# Patient Record
Sex: Male | Born: 1965 | Race: White | Hispanic: No | Marital: Married | Smoking: Never smoker
Health system: Southern US, Community
[De-identification: ages and names within clinical notes are randomized; demographics above are authoritative.]

## PROBLEM LIST (undated history)

## (undated) DIAGNOSIS — R519 Headache, unspecified: Secondary | ICD-10-CM

## (undated) DIAGNOSIS — M5481 Occipital neuralgia: Secondary | ICD-10-CM

## (undated) DIAGNOSIS — G8929 Other chronic pain: Secondary | ICD-10-CM

## (undated) DIAGNOSIS — J302 Other seasonal allergic rhinitis: Secondary | ICD-10-CM

## (undated) HISTORY — DX: Occipital neuralgia: M54.81

## (undated) HISTORY — DX: Other seasonal allergic rhinitis: J30.2

## (undated) HISTORY — PX: KNEE SURGERY: SHX244

## (undated) HISTORY — DX: Other chronic pain: G89.29

## (undated) HISTORY — PX: NOSE SURGERY: SHX723

## (undated) HISTORY — DX: Headache, unspecified: R51.9

---

## 2005-01-22 ENCOUNTER — Emergency Department: Payer: Self-pay | Admitting: Unknown Physician Specialty

## 2011-09-15 ENCOUNTER — Emergency Department: Payer: Self-pay | Admitting: General Practice

## 2012-10-15 ENCOUNTER — Ambulatory Visit: Payer: Self-pay | Admitting: General Practice

## 2013-10-07 ENCOUNTER — Ambulatory Visit: Payer: Self-pay | Admitting: General Practice

## 2013-11-03 ENCOUNTER — Ambulatory Visit: Payer: Self-pay | Admitting: General Practice

## 2013-12-29 ENCOUNTER — Emergency Department: Payer: Self-pay | Admitting: General Practice

## 2018-01-21 ENCOUNTER — Other Ambulatory Visit: Payer: Self-pay | Admitting: Neurology

## 2018-01-21 DIAGNOSIS — M5481 Occipital neuralgia: Secondary | ICD-10-CM

## 2018-01-29 ENCOUNTER — Ambulatory Visit: Payer: Self-pay

## 2018-03-31 DIAGNOSIS — M5481 Occipital neuralgia: Secondary | ICD-10-CM | POA: Insufficient documentation

## 2018-03-31 DIAGNOSIS — G43019 Migraine without aura, intractable, without status migrainosus: Secondary | ICD-10-CM | POA: Insufficient documentation

## 2019-02-24 ENCOUNTER — Other Ambulatory Visit: Payer: Self-pay

## 2019-04-25 ENCOUNTER — Other Ambulatory Visit: Payer: Self-pay

## 2019-04-25 ENCOUNTER — Ambulatory Visit: Payer: Self-pay | Admitting: Internal Medicine

## 2019-04-25 ENCOUNTER — Encounter: Payer: Self-pay | Admitting: Internal Medicine

## 2019-04-25 VITALS — BP 150/104 | HR 84 | Temp 98.3°F | Ht 75.0 in | Wt 268.0 lb

## 2019-04-25 DIAGNOSIS — R030 Elevated blood-pressure reading, without diagnosis of hypertension: Secondary | ICD-10-CM

## 2019-04-25 DIAGNOSIS — L03114 Cellulitis of left upper limb: Secondary | ICD-10-CM

## 2019-04-25 DIAGNOSIS — W57XXXA Bitten or stung by nonvenomous insect and other nonvenomous arthropods, initial encounter: Secondary | ICD-10-CM

## 2019-04-25 MED ORDER — CLINDAMYCIN HCL 300 MG PO CAPS: 300.0000 mg | ORAL_CAPSULE | Freq: Three times a day (TID) | ORAL | 0 refills | Status: DC

## 2019-04-25 NOTE — Progress Notes (Signed)
S - Patient presents with skin lesion, was working in an attic last week and had a bug bite concern on his left hand & one on his forearm noted the next day, not sure of the bug, not see one and the hand lesion cleared up nicely, but the one on the forearm was more red and swollen by Friday. Then Sat was painful at night and felt up his arm some and even into his armpit some. The redness did not extend up into his elbow or armpit in a linear fashion, with some redness around the lesion extending. He iced yesterday and last night and thinks is a little better today, less swollen and painful.  Was itchy ealry, then more painful No purulent discharge from the area noted No doc'ed fevers, has not felt feverish, not feeling ill No N/V Has applied a neosporin generic type product and covered and then tightly with wrapping an overlying ACE some No h/o MRSA Not think a tick bite BP's have been high in the past at times, and then when has f/u checks, have been good and has a way to check at home but hasn't recently, never on meds for BP control Currently retired  Current Outpatient Medications on File Prior to Visit  Medication Sig Dispense Refill  . baclofen (LIORESAL) 10 MG tablet Take 1 tablet by mouth 2 (two) times a day.    . cetirizine (ZYRTEC) 10 MG tablet Take 10 mg by mouth daily.    . fluticasone (FLONASE) 50 MCG/ACT nasal spray Place 1 spray into both nostrils daily.     No current facility-administered medications on file prior to visit.      Allergies  Allergen Reactions  . Penicillins     Noted was hives and swelling when very young, not challenged since  No tob history  O - NAD, not ill appearing, overweight, masked  BP (!) 150/104 (BP Location: Right Arm, Patient Position: Sitting, Cuff Size: Large)   Pulse 84   Temp 98.3 F (36.8 C) (Oral)   Ht 6\' 3"  (1.905 m)   Wt 268 lb (121.6 kg)   SpO2 97%   BMI 33.50 kg/m     BP high today.  HEENT - sclera ancteric Skin -  erythematous raised area approx  Half-dollar-sized on left forearmin the distal 1/3,   Mild induration surrounding with central puncture type site, no drainage   Tender to palpation   Fading erythema noted surrounding and extended much greater cephalad to about mid forearm level, no linear erythematous streaks,  No obvious epitroch nodes,  no axillary adenopathy nor marked tenderness palpating the axilla Car - RRR, not tachycardic Affect not flat, approp with conversation   Ass - Abscess with cellulitis concern, probable due to bug bite by history  PCN allergy hx  Increased BP concern - possibly worsened with above infection and pain recent past (he noted hasn't slept well in last 3 nights)  Plan - Educated,  Warm compresses several times daily to help short term, can apply cold prn at times if more swelling as well Abx indicated and clindamycin tid prescribed - warned if gets diarrhea to stop and contact us as a colitis concern then arises with this abx (C Diff) and he understood Keep clean with warm, soapy water and avoid neosporin product short term, loosely cover prn with a nonstick pad and tape used to cover today and gave him some to use short term Ibuprofen with food or tylenol prn if more  painful Follow-up if not better or worsening, and if getting fevers, redness rapidly spreading, follow up more emergently in an ER setting rec'ed (as likely need IV medicines)  He will check his BP's at home as this is improved, and if the top number is >140 or bottom number > 90 with any regularity, needs to call and f/u again and he understood the importance of  this as well.

## 2019-07-25 ENCOUNTER — Ambulatory Visit: Payer: 59 | Admitting: Internal Medicine

## 2019-07-25 ENCOUNTER — Other Ambulatory Visit: Payer: Self-pay

## 2019-07-25 ENCOUNTER — Encounter: Payer: Self-pay | Admitting: Internal Medicine

## 2019-07-25 VITALS — BP 155/105 | HR 77 | Temp 97.6°F | Resp 12 | Ht 75.0 in | Wt 263.0 lb

## 2019-07-25 DIAGNOSIS — I10 Essential (primary) hypertension: Secondary | ICD-10-CM | POA: Insufficient documentation

## 2019-07-25 DIAGNOSIS — E7849 Other hyperlipidemia: Secondary | ICD-10-CM | POA: Insufficient documentation

## 2019-07-25 DIAGNOSIS — E6609 Other obesity due to excess calories: Secondary | ICD-10-CM | POA: Insufficient documentation

## 2019-07-25 DIAGNOSIS — R030 Elevated blood-pressure reading, without diagnosis of hypertension: Secondary | ICD-10-CM | POA: Insufficient documentation

## 2019-07-25 DIAGNOSIS — E782 Mixed hyperlipidemia: Secondary | ICD-10-CM | POA: Insufficient documentation

## 2019-07-25 DIAGNOSIS — R7301 Impaired fasting glucose: Secondary | ICD-10-CM | POA: Insufficient documentation

## 2019-07-25 DIAGNOSIS — E66811 Obesity, class 1: Secondary | ICD-10-CM | POA: Insufficient documentation

## 2019-07-25 MED ORDER — LISINOPRIL 10 MG PO TABS: 10.0000 mg | ORAL_TABLET | Freq: Every day | ORAL | 3 refills | Status: DC

## 2019-07-25 NOTE — Progress Notes (Signed)
S - Patient with a h/o higher BP's on prior visit in June when seen for cellulitis concern,  who follows-up today.  He was to check his BP's at home as improved, and if the top number was >140 or bottom number > 90 with any regularity, was to call and f/u again and he understood the importance of  this as well. He noted it has been higher at home with checks, often in the 140+ range over 100.  He also has been having more pressure type HA's, no vision changes. Never on BP meds in past, exercises including aerobic and feels better with exercise, no sx's with exercise Tries to eat healthy He just had ultrasounds done of his carotids, aorta and GB and told had GB stones but without any sx's, and other tests all good.  No CP, SOB, LE swelling. No h/o heart disease and had seen a cardiologist about 2-3 years ago and had a stress test and told was very good and no concerns. Does note occas when first stands up, can feel a little lightheaded.   Allergies  Allergen Reactions  . Penicillins    Current Outpatient Medications on File Prior to Visit  Medication Sig Dispense Refill  . baclofen (LIORESAL) 10 MG tablet Take 1 tablet by mouth 2 (two) times a day.    . cetirizine (ZYRTEC) 10 MG tablet Take 10 mg by mouth daily.    . fluticasone (FLONASE) 50 MCG/ACT nasal spray Place 1 spray into both nostrils daily.     No current facility-administered medications on file prior to visit.      Never smoker or used smokeless tob  FH - no HTN in M,D, GF had HD and may be due to alcohol   O - NAD, masked  BP (!) 155/105 (BP Location: Right Arm, Patient Position: Sitting, Cuff Size: Large)   Pulse 77   Temp 97.6 F (36.4 C) (Oral)   Resp 12   Ht 6\' 3"  (1.905 m)   Wt 263 lb (119.3 kg)   SpO2 98%   BMI 32.87 kg/m  Recheck manually - 138/94 on right by me Nurse checked orthostatics and reviewed   BP last visit - 150/104   HEENT - sclera anicteric Neck - carotids 2+ and = with no bruits Car -  RRR without m/g/r Pulm - CTA Abd - mildly obese,NT Ext - no LE edema Neuro - Affect not flat, approp with conversation   Last labs reviewed - 09/2018 - kidney fcn good, LDL -78, TC - 152, HDL - 39,TG's - 176, glc - 103 (Due again to check in Nov this year)  ECG from 11/19 reviewed- no concerning change from prior, SR, did have Q's inf questioning a possible inf infarct in past but not changed from recent ECGs prior    A/P -1.  HTN - concern BP's not very well controlled   Discussed BP medications to manage, discussed addition and he agreed to start lisinopril 10mg  daily, did warn of potential cough SE and usually very well tolerated noted Discussed goals for good control of BP  Importance of healthy diet and regular aerobic exercise and weight control noted Continue with home BP checks periodically after on med for a week, and keep a log discussed, and bring on F/u visit in 4 weeks, f/u sooner prn Emphasized staying well hydrated as may be an issue with his mild orthostatic sx's, as has been very hot weather in recent past   2. Increased BMI  Educated on this as he was noted to be obese by insurance evaluation  Importance of diet modifications and regular aerobic exercise emphasized Appropriate weight loss goals discussed with lifestyle changes adopted for the long term very important for success  May check labs sooner (on f/u rather than wait til Nov) to make sure tolerating the ACE inhibitor and also noting had borderline IFG and hyperlipidemia on last check (TG's up, HDL low).   F/u 4 weeks, sooner prn

## 2019-07-25 NOTE — Patient Instructions (Signed)

## 2019-07-25 NOTE — Progress Notes (Signed)
On 04/25/2019 visit, BP was 150/104.  States has checked some since last visit & it's been up.  Been eating right..  Different - when bends over & straightens up gets a little dizzy when stands up.  Head feels like it's like pressure in whole head.  Different type of H/A than occipital neuralgia.  Standing BP = 137/99 Lying BP = 134/88 Sitting BP = 139/94

## 2019-08-10 ENCOUNTER — Telehealth: Payer: Self-pay | Admitting: Registered Nurse

## 2019-08-10 ENCOUNTER — Encounter: Payer: Self-pay | Admitting: Registered Nurse

## 2019-08-10 MED ORDER — FLUTICASONE PROPIONATE 50 MCG/ACT NA SUSP: 1.0000 | Freq: Every day | NASAL | 5 refills | Status: DC

## 2019-08-10 NOTE — Telephone Encounter (Signed)
Last office visit 07/25/2019 with Dr Roxan Hockey.  Per paper chart review has been on flonase since 2010.  Last fill 02/02/2019 #1 RF5.  Electronic Rx sent to his pharmacy of choice Fluticasone 1 spray each nostril daily #1 RF5

## 2019-08-22 ENCOUNTER — Ambulatory Visit: Payer: 59 | Admitting: Physician Assistant

## 2019-08-22 ENCOUNTER — Encounter: Payer: Self-pay | Admitting: Physician Assistant

## 2019-08-22 ENCOUNTER — Other Ambulatory Visit: Payer: Self-pay

## 2019-08-22 DIAGNOSIS — Z23 Encounter for immunization: Secondary | ICD-10-CM

## 2019-08-22 NOTE — Progress Notes (Signed)
   Subjective:BP Check    Patient ID: Tony Alvarado, male    DOB: 09/27/1966, 53 y.o.   MRN: [id number]  HPI Patient presents for re-evaluation of elevated BP since starting Lisinopril last month. Denies any adverse affects from medications. Previous BP 150/105   Review of Systems Elevate BP  Overweight    Objective:   Physical Exam HEENT unremarkable Neck supple w/o adenopathy or bruits Lungs CTA Heart RRR       Assessment & Plan:  Decreased BP from 150/105 to 130/90. Continue medication and follow up in one week.

## 2019-08-22 NOTE — Progress Notes (Signed)
Started Lisinopril 10 mg 1 tab po qd after last visit with Dr. Roxan Hockey on 07/25/2019.  BP = 155/105 at 07/25/2019 visit. BP = 130/90 at today's visit.

## 2019-08-25 DIAGNOSIS — E538 Deficiency of other specified B group vitamins: Secondary | ICD-10-CM | POA: Diagnosis not present

## 2019-08-25 DIAGNOSIS — G43019 Migraine without aura, intractable, without status migrainosus: Secondary | ICD-10-CM | POA: Diagnosis not present

## 2019-08-25 DIAGNOSIS — E559 Vitamin D deficiency, unspecified: Secondary | ICD-10-CM | POA: Diagnosis not present

## 2019-08-25 DIAGNOSIS — M5481 Occipital neuralgia: Secondary | ICD-10-CM | POA: Diagnosis not present

## 2019-09-14 ENCOUNTER — Telehealth: Payer: Self-pay

## 2019-09-14 NOTE — Telephone Encounter (Signed)
Gerarda Fraction, NP-C (Interim Provider) reviewed office notes from Dr. Jennings Books (Neurologist at [facility]) from 08/25/2019 visit.  She requested F/U of whether Sam is taking Vitamin D, Magnesium & Motrin. Recommended he schedule a follow-up appt.  Called Sam.  He is taking Vitamin D, Vitamin B12 & Magnesium. He is using Motrin prn for headaches . He didn't say if he was using a "snore app". He decided to schedule a physical instead of just a follow up appt.  He is unavailable 12/4 - 12/20.  Labs scheduled for 11/16/19 at 8:30 am & Physical appt scheduled for 11/23/19 at 10:00 am.  AMD

## 2019-09-14 NOTE — Telephone Encounter (Signed)
noted 

## 2019-10-05 DIAGNOSIS — Z03818 Encounter for observation for suspected exposure to other biological agents ruled out: Secondary | ICD-10-CM | POA: Diagnosis not present

## 2019-10-20 ENCOUNTER — Encounter: Payer: Self-pay | Admitting: Neurology

## 2019-12-22 ENCOUNTER — Ambulatory Visit: Payer: Self-pay

## 2019-12-28 NOTE — Progress Notes (Signed)
Patient comes in today for pre physical labs and EKG. Patient is scheduled with Albina Billet, PA-C on 01/04/2020.

## 2019-12-29 ENCOUNTER — Other Ambulatory Visit: Payer: Self-pay

## 2019-12-29 ENCOUNTER — Ambulatory Visit: Payer: 59

## 2019-12-29 DIAGNOSIS — Z0000 Encounter for general adult medical examination without abnormal findings: Secondary | ICD-10-CM

## 2019-12-29 LAB — POCT URINALYSIS DIPSTICK
Bilirubin, UA: NEGATIVE
Blood, UA: NEGATIVE
Glucose, UA: NEGATIVE
Ketones, UA: NEGATIVE
Leukocytes, UA: NEGATIVE
Nitrite, UA: NEGATIVE
Protein, UA: NEGATIVE
Spec Grav, UA: 1.02 (ref 1.010–1.025)
Urobilinogen, UA: 0.2 E.U./dL
pH, UA: 7 (ref 5.0–8.0)

## 2019-12-30 LAB — CMP12+LP+TP+TSH+6AC+PSA+CBC…
ALT: 27 IU/L (ref 0–44)
AST: 20 IU/L (ref 0–40)
Albumin/Globulin Ratio: 2.3 — ABNORMAL HIGH (ref 1.2–2.2)
Albumin: 4.5 g/dL (ref 3.8–4.9)
Alkaline Phosphatase: 62 IU/L (ref 39–117)
BUN/Creatinine Ratio: 10 (ref 9–20)
BUN: 13 mg/dL (ref 6–24)
Basophils Absolute: 0 x10E3/uL (ref 0.0–0.2)
Basos: 1 %
Bilirubin Total: 0.7 mg/dL (ref 0.0–1.2)
Calcium: 9.5 mg/dL (ref 8.7–10.2)
Chloride: 101 mmol/L (ref 96–106)
Chol/HDL Ratio: 3.5 ratio (ref 0.0–5.0)
Cholesterol, Total: 169 mg/dL (ref 100–199)
Creatinine, Ser: 1.26 mg/dL (ref 0.76–1.27)
EOS (ABSOLUTE): 0.1 x10E3/uL (ref 0.0–0.4)
Eos: 3 %
Estimated CHD Risk: 0.6 times avg. (ref 0.0–1.0)
Free Thyroxine Index: 1.9 (ref 1.2–4.9)
GFR calc Af Amer: 75 mL/min/1.73 (ref >59)
GFR calc non Af Amer: 65 mL/min/1.73 (ref >59)
GGT: 16 IU/L (ref 0–65)
Globulin, Total: 2 g/dL (ref 1.5–4.5)
Glucose: 99 mg/dL (ref 65–99)
HDL: 48 mg/dL (ref >39)
Hematocrit: 41.5 % (ref 37.5–51.0)
Hemoglobin: 14.6 g/dL (ref 13.0–17.7)
Immature Grans (Abs): 0 x10E3/uL (ref 0.0–0.1)
Immature Granulocytes: 0 %
Iron: 128 ug/dL (ref 38–169)
LDH: 170 IU/L (ref 121–224)
LDL Chol Calc (NIH): 102 mg/dL — ABNORMAL HIGH (ref 0–99)
Lymphocytes Absolute: 1.3 x10E3/uL (ref 0.7–3.1)
Lymphs: 31 %
MCH: 31.7 pg (ref 26.6–33.0)
MCHC: 35.2 g/dL (ref 31.5–35.7)
MCV: 90 fL (ref 79–97)
Monocytes Absolute: 0.4 x10E3/uL (ref 0.1–0.9)
Monocytes: 9 %
Neutrophils Absolute: 2.4 x10E3/uL (ref 1.4–7.0)
Neutrophils: 56 %
Phosphorus: 3.1 mg/dL (ref 2.8–4.1)
Platelets: 227 x10E3/uL (ref 150–450)
Potassium: 4.5 mmol/L (ref 3.5–5.2)
Prostate Specific Ag, Serum: 1.3 ng/mL (ref 0.0–4.0)
RBC: 4.6 x10E6/uL (ref 4.14–5.80)
RDW: 12.4 % (ref 11.6–15.4)
Sodium: 138 mmol/L (ref 134–144)
T3 Uptake Ratio: 29 % (ref 24–39)
T4, Total: 6.6 ug/dL (ref 4.5–12.0)
TSH: 2.11 u[IU]/mL (ref 0.450–4.500)
Total Protein: 6.5 g/dL (ref 6.0–8.5)
Triglycerides: 106 mg/dL (ref 0–149)
Uric Acid: 5.7 mg/dL (ref 3.8–8.4)
VLDL Cholesterol Cal: 19 mg/dL (ref 5–40)
WBC: 4.2 x10E3/uL (ref 3.4–10.8)

## 2020-01-04 ENCOUNTER — Ambulatory Visit: Payer: 59 | Admitting: Registered Nurse

## 2020-01-04 ENCOUNTER — Other Ambulatory Visit: Payer: Self-pay

## 2020-01-04 VITALS — BP 140/94 | HR 72 | Temp 97.9°F | Ht 74.0 in | Wt 256.2 lb

## 2020-01-04 DIAGNOSIS — R7989 Other specified abnormal findings of blood chemistry: Secondary | ICD-10-CM

## 2020-01-04 DIAGNOSIS — E538 Deficiency of other specified B group vitamins: Secondary | ICD-10-CM

## 2020-01-04 DIAGNOSIS — Z0000 Encounter for general adult medical examination without abnormal findings: Secondary | ICD-10-CM

## 2020-01-04 DIAGNOSIS — J301 Allergic rhinitis due to pollen: Secondary | ICD-10-CM

## 2020-01-04 DIAGNOSIS — Z6832 Body mass index (BMI) 32.0-32.9, adult: Secondary | ICD-10-CM

## 2020-01-04 DIAGNOSIS — R481 Agnosia: Secondary | ICD-10-CM

## 2020-01-04 DIAGNOSIS — R430 Anosmia: Secondary | ICD-10-CM

## 2020-01-04 DIAGNOSIS — I10 Essential (primary) hypertension: Secondary | ICD-10-CM

## 2020-01-04 DIAGNOSIS — G44221 Chronic tension-type headache, intractable: Secondary | ICD-10-CM

## 2020-01-04 MED ORDER — LISINOPRIL 10 MG PO TABS: ORAL_TABLET | ORAL | 3 refills | Status: DC

## 2020-01-04 MED ORDER — FLUTICASONE PROPIONATE 50 MCG/ACT NA SUSP: 1.0000 | Freq: Every day | NASAL | 5 refills | Status: DC

## 2020-01-04 NOTE — Patient Instructions (Addendum)
Muscle Cramps and Spasms Muscle cramps and spasms occur when a muscle or muscles tighten and you have no control over this tightening (involuntary muscle contraction). They are a common problem and can develop in any muscle. The most common place is in the calf muscles of the leg. Muscle cramps and muscle spasms are both involuntary muscle contractions, but there are some differences between the two:  Muscle cramps are painful. They come and go and may last for a few seconds or up to 15 minutes. Muscle cramps are often more forceful and last longer than muscle spasms.  Muscle spasms may or may not be painful. They may also last just a few seconds or much longer. Certain medical conditions, such as diabetes or Parkinson's disease, can make it more likely to develop cramps or spasms. However, cramps or spasms are usually not caused by a serious underlying problem. Common causes include:  Doing more physical work or exercise than your body is ready for (overexertion).  Overuse from repeating certain movements too many times.  Remaining in a certain position for a long period of time.  Improper preparation, form, or technique while playing a sport or doing an activity.  Dehydration.  Injury.  Side effects of some medicines.  Abnormally low levels of the salts and minerals in your blood (electrolytes), especially potassium and calcium. This could happen if you are taking water pills (diuretics) or if you are pregnant. In many cases, the cause of muscle cramps or spasms is not known. Follow these instructions at home: Managing pain and stiffness      Try massaging, stretching, and relaxing the affected muscle. Do this for several minutes at a time.  If directed, apply heat to tight or tense muscles as often as told by your health care provider. Use the heat source that your health care provider recommends, such as a moist heat pack or a heating pad. ? Place a towel between your skin and  the heat source. ? Leave the heat on for 20-30 minutes. ? Remove the heat if your skin turns bright red. This is especially important if you are unable to feel pain, heat, or cold. You may have a greater risk of getting burned.  If directed, put ice on the affected area. This may help if you are sore or have pain after a cramp or spasm. ? Put ice in a plastic bag. ? Place a towel between your skin and the bag. ? Leavethe ice on for 20 minutes, 2-3 times a day.  Try taking hot showers or baths to help relax tight muscles. Eating and drinking  Drink enough fluid to keep your urine pale yellow. Staying well hydrated may help prevent cramps or spasms.  Eat a healthy diet that includes plenty of nutrients to help your muscles function. A healthy diet includes fruits and vegetables, lean protein, whole grains, and low-fat or nonfat dairy products. General instructions  If you are having frequent cramps, avoid intense exercise for several days.  Take over-the-counter and prescription medicines only as told by your health care provider.  Pay attention to any changes in your symptoms.  Keep all follow-up visits as told by your health care provider. This is important. Contact a health care provider if:  Your cramps or spasms get more severe or happen more often.  Your cramps or spasms do not improve over time. Summary  Muscle cramps and spasms occur when a muscle or muscles tighten and you have no control over this   tightening (involuntary muscle contraction).  The most common place for cramps or spasms to occur is in the calf muscles of the leg.  Massaging, stretching, and relaxing the affected muscle may relieve the cramp or spasm.  Drink enough fluid to keep your urine pale yellow. Staying well hydrated may help prevent cramps or spasms. This information is not intended to replace advice given to you by your health care provider. Make sure you discuss any questions you have with your  health care provider. Document Revised: 03/15/2018 Document Reviewed: 03/15/2018 Elsevier Patient Education  2020 [employer]. Tension Headache, Adult A tension headache is a feeling of pain, pressure, or aching in the head that is often felt over the front and sides of the head. The pain can be dull, or it can feel tight (constricting). There are two types of tension headache:  Episodic tension headache. This is when the headaches happen fewer than 15 days a month.  Chronic tension headache. This is when the headaches happen more than 15 days a month during a 17-month period. A tension headache can last from 30 minutes to several days. It is the most common kind of headache. Tension headaches are not normally associated with nausea or vomiting, and they do not get worse with physical activity. What are the causes? The exact cause of this condition is not known. Tension headaches are often triggered by stress, anxiety, or depression. Other triggers include:  Alcohol.  Too much caffeine or caffeine withdrawal.  Respiratory infections, such as colds, flu, or sinus infections.  Dental problems or teeth clenching.  Tiredness (fatigue).  Holding your head and neck in the same position for a long period of time, such as while using a computer.  Smoking.  Arthritis of the neck. What are the signs or symptoms? Symptoms of this condition include:  A feeling of pressure or tightness around the head.  Dull, aching head pain.  Pain over the front and sides of the head.  Tenderness in the muscles of the head, neck, and shoulders. How is this diagnosed? This condition may be diagnosed based on your symptoms, your medical history, and a physical exam. If your symptoms are severe or unusual, you may have imaging tests, such as a CT scan or an MRI of your head. Your vision may also be checked. How is this treated? This condition may be treated with lifestyle changes and with medicines that  help relieve symptoms. Follow these instructions at home: Managing pain  Take over-the-counter and prescription medicines only as told by your health care provider.  When you have a headache, lie down in a dark, quiet room.  If directed, apply ice to the head and neck: ? Put ice in a plastic bag. ? Place a towel between your skin and the bag. ? Leave the ice on for 20 minutes, 2-3 times a day.  If directed, apply heat to the back of your neck as often as told by your health care provider. Use the heat source that your health care provider recommends, such as a moist heat pack or a heating pad. ? Place a towel between your skin and the heat source. ? Leave the heat on for 20-30 minutes. ? Remove the heat if your skin turns bright red. This is especially important if you are unable to feel pain, heat, or cold. You may have a greater risk of getting burned. Eating and drinking  Eat meals on a regular schedule.  Limit alcohol intake to no  more than 1 drink a day for nonpregnant women and 2 drinks a day for men. One drink equals 12 oz of beer, 5 oz of wine, or 1 oz of hard liquor.  Drink enough fluid to keep your urine pale yellow.  Decrease your caffeine intake, or stop using caffeine. Lifestyle  Get 7-9 hours of sleep each night, or get the amount of sleep recommended by your health care provider.  At bedtime, remove all electronic devices from your room. Electronic devices include computers, phones, and tablets.  Find ways to manage your stress. Some things that can help relieve stress include: ? Exercise. ? Deep breathing exercises. ? Yoga. ? Listening to music. ? Positive mental imagery.  Try to sit up straight and avoid tensing your muscles.  Do not use any products that contain nicotine or tobacco, such as cigarettes and e-cigarettes. If you need help quitting, ask your health care provider. General instructions   Keep all follow-up visits as told by your health care  provider. This is important.  Avoid any headache triggers. Keep a headache journal to help find out what may trigger your headaches. For example, write down: ? What you eat and drink. ? How much sleep you get. ? Any change to your diet or medicines. Contact a health care provider if:  Your headache does not get better.  Your headache comes back.  You are sensitive to sounds, light, or smells because of a headache.  You have nausea or you vomit.  Your stomach hurts. Get help right away if:  You suddenly develop a very severe headache along with any of the following: ? A stiff neck. ? Nausea and vomiting. ? Confusion. ? Weakness. ? Double vision or loss of vision. ? Shortness of breath. ? Rash. ? Unusual sleepiness. ? Fever. ? Trouble speaking. ? Pain in your eyes or ears. ? Trouble walking or balancing. ? Feeling faint or passing out. Summary  A tension headache is a feeling of pain, pressure, or aching in the head that is often felt over the front and sides of the head.  A tension headache can last from 30 minutes to several days. It is the most common kind of headache.  This condition may be diagnosed based on your symptoms, your medical history, and a physical exam.  This condition may be treated with lifestyle changes and with medicines that help relieve symptoms. This information is not intended to replace advice given to you by your health care provider. Make sure you discuss any questions you have with your health care provider. Document Revised: 08/17/2019 Document Reviewed: 01/30/2017 Elsevier Patient Education  2020 [employer]. Gallbladder Eating Plan If you have a gallbladder condition, you may have trouble digesting fats. Eating a low-fat diet can help reduce your symptoms, and may be helpful before and after having surgery to remove your gallbladder (cholecystectomy). Your health care provider may recommend that you work with a diet and nutrition  specialist (dietitian) to help you reduce the amount of fat in your diet. What are tips for following this plan? General guidelines  Limit your fat intake to less than 30% of your total daily calories. If you eat around 1,800 calories each day, this is less than 60 grams (g) of fat per day.  Fat is an important part of a healthy diet. Eating a low-fat diet can make it hard to maintain a healthy body weight. Ask your dietitian how much fat, calories, and other nutrients you need each day.  Eat small, frequent meals throughout the day instead of three large meals.  Drink at least 8-10 cups of fluid a day. Drink enough fluid to keep your urine clear or pale yellow.  Limit alcohol intake to no more than 1 drink a day for nonpregnant women and 2 drinks a day for men. One drink equals 12 oz of beer, 5 oz of wine, or 1 oz of hard liquor. Reading food labels  Check Nutrition Facts on food labels for the amount of fat per serving. Choose foods with less than 3 grams of fat per serving. Shopping  Choose nonfat and low-fat healthy foods. Look for the words "nonfat," "low fat," or "fat free."  Avoid buying processed or prepackaged foods. Cooking  Cook using low-fat methods, such as baking, broiling, grilling, or boiling.  Cook with small amounts of healthy fats, such as olive oil, grapeseed oil, canola oil, or sunflower oil. What foods are recommended?   All fresh, frozen, or canned fruits and vegetables.  Whole grains.  Low-fat or non-fat (skim) milk and yogurt.  Lean meat, skinless poultry, fish, eggs, and beans.  Low-fat protein supplement powders or drinks.  Spices and herbs. What foods are not recommended?  High-fat foods. These include baked goods, fast food, fatty cuts of meat, ice cream, french toast, sweet rolls, pizza, cheese bread, foods covered with butter, creamy sauces, or cheese.  Fried foods. These include french fries, tempura, battered fish, breaded chicken, fried  breads, and sweets.  Foods with strong odors.  Foods that cause bloating and gas. Summary  A low-fat diet can be helpful if you have a gallbladder condition, or before and after gallbladder surgery.  Limit your fat intake to less than 30% of your total daily calories. This is about 60 g of fat if you eat 1,800 calories each day.  Eat small, frequent meals throughout the day instead of three large meals. This information is not intended to replace advice given to you by your health care provider. Make sure you discuss any questions you have with your health care provider. Document Revised: 02/10/2019 Document Reviewed: 11/27/2016 Elsevier Patient Education  [place]. High-Fiber Diet Fiber, also called dietary fiber, is a type of carbohydrate that is found in fruits, vegetables, whole grains, and beans. A high-fiber diet can have many health benefits. Your health care provider may recommend a high-fiber diet to help:  Prevent constipation. Fiber can make your bowel movements more regular.  Lower your cholesterol.  Relieve the following conditions: ? Swelling of veins in the anus (hemorrhoids). ? Swelling and irritation (inflammation) of specific areas of the digestive tract (uncomplicated diverticulosis). ? A problem of the large intestine (colon) that sometimes causes pain and diarrhea (irritable bowel syndrome, IBS).  Prevent overeating as part of a weight-loss plan.  Prevent heart disease, type 2 diabetes, and certain cancers. What is my plan? The recommended daily fiber intake in grams (g) includes:  38 g for men age 69 or younger.  30 g for men over age 64.  8 g for women age 40 or younger.  21 g for women over age 66. You can get the recommended daily intake of dietary fiber by:  Eating a variety of fruits, vegetables, grains, and beans.  Taking a fiber supplement, if it is not possible to get enough fiber through your diet. What do I need to know about a  high-fiber diet?  It is better to get fiber through food sources rather than from fiber supplements.  There is not a lot of research about how effective supplements are.  Always check the fiber content on the nutrition facts label of any prepackaged food. Look for foods that contain 5 g of fiber or more per serving.  Talk with a diet and nutrition specialist (dietitian) if you have questions about specific foods that are recommended or not recommended for your medical condition, especially if those foods are not listed below.  Gradually increase how much fiber you consume. If you increase your intake of dietary fiber too quickly, you may have bloating, cramping, or gas.  Drink plenty of water. Water helps you to digest fiber. What are tips for following this plan?  Eat a wide variety of high-fiber foods.  Make sure that half of the grains that you eat each day are whole grains.  Eat breads and cereals that are made with whole-grain flour instead of refined flour or white flour.  Eat brown rice, bulgur wheat, or millet instead of white rice.  Start the day with a breakfast that is high in fiber, such as a cereal that contains 5 g of fiber or more per serving.  Use beans in place of meat in soups, salads, and pasta dishes.  Eat high-fiber snacks, such as berries, raw vegetables, nuts, and popcorn.  Choose whole fruits and vegetables instead of processed forms like juice or sauce. What foods can I eat?  Fruits Berries. Pears. Apples. Oranges. Avocado. Prunes and raisins. Dried figs. Vegetables Sweet potatoes. Spinach. Kale. Artichokes. Cabbage. Broccoli. Cauliflower. Green peas. Carrots. Squash. Grains Whole-grain breads. Multigrain cereal. Oats and oatmeal. Brown rice. Barley. Bulgur wheat. Millet. Quinoa. Bran muffins. Popcorn. Rye wafer crackers. Meats and other proteins Navy, kidney, and pinto beans. Soybeans. Split peas. Lentils. Nuts and seeds. Dairy Fiber-fortified  yogurt. Beverages Fiber-fortified soy milk. Fiber-fortified orange juice. Other foods Fiber bars. The items listed above may not be a complete list of recommended foods and beverages. Contact a dietitian for more options. What foods are not recommended? Fruits Fruit juice. Cooked, strained fruit. Vegetables Fried potatoes. Canned vegetables. Well-cooked vegetables. Grains White bread. Pasta made with refined flour. White rice. Meats and other proteins Fatty cuts of meat. Fried chicken or fried fish. Dairy Milk. Yogurt. Cream cheese. Sour cream. Fats and oils Butters. Beverages Soft drinks. Other foods Cakes and pastries. The items listed above may not be a complete list of foods and beverages to avoid. Contact a dietitian for more information. Summary  Fiber is a type of carbohydrate. It is found in fruits, vegetables, whole grains, and beans.  There are many health benefits of eating a high-fiber diet, such as preventing constipation, lowering blood cholesterol, helping with weight loss, and reducing your risk of heart disease, diabetes, and certain cancers.  Gradually increase your intake of fiber. Increasing too fast can result in cramping, bloating, and gas. Drink plenty of water while you increase your fiber.  The best sources of fiber include whole fruits and vegetables, whole grains, nuts, seeds, and beans. This information is not intended to replace advice given to you by your health care provider. Make sure you discuss any questions you have with your health care provider. Document Revised: 08/24/2017 Document Reviewed: 08/24/2017 Elsevier Patient Education  2020 [employer] Inc. DASH Eating Plan DASH stands for "Dietary Approaches to Stop Hypertension." The DASH eating plan is a healthy eating plan that has been shown to reduce high blood pressure (hypertension). It may also reduce your risk for type 2 diabetes, heart disease, and  stroke. The DASH eating plan may also  help with weight loss. What are tips for following this plan?  General guidelines  Avoid eating more than 2,300 mg (milligrams) of salt (sodium) a day. If you have hypertension, you may need to reduce your sodium intake to 1,500 mg a day.  Limit alcohol intake to no more than 1 drink a day for nonpregnant women and 2 drinks a day for men. One drink equals 12 oz of beer, 5 oz of wine, or 1 oz of hard liquor.  Work with your health care provider to maintain a healthy body weight or to lose weight. Ask what an ideal weight is for you.  Get at least 30 minutes of exercise that causes your heart to beat faster (aerobic exercise) most days of the week. Activities may include walking, swimming, or biking.  Work with your health care provider or diet and nutrition specialist (dietitian) to adjust your eating plan to your individual calorie needs. Reading food labels   Check food labels for the amount of sodium per serving. Choose foods with less than 5 percent of the Daily Value of sodium. Generally, foods with less than 300 mg of sodium per serving fit into this eating plan.  To find whole grains, look for the word "whole" as the first word in the ingredient list. Shopping  Buy products labeled as "low-sodium" or "no salt added."  Buy fresh foods. Avoid canned foods and premade or frozen meals. Cooking  Avoid adding salt when cooking. Use salt-free seasonings or herbs instead of table salt or sea salt. Check with your health care provider or pharmacist before using salt substitutes.  Do not fry foods. Cook foods using healthy methods such as baking, boiling, grilling, and broiling instead.  Cook with heart-healthy oils, such as olive, canola, soybean, or sunflower oil. Meal planning  Eat a balanced diet that includes: ? 5 or more servings of fruits and vegetables each day. At each meal, try to fill half of your plate with fruits and vegetables. ? Up to 6-8 servings of whole grains each  day. ? Less than 6 oz of lean meat, poultry, or fish each day. A 3-oz serving of meat is about the same size as a deck of cards. One egg equals 1 oz. ? 2 servings of low-fat dairy each day. ? A serving of nuts, seeds, or beans 5 times each week. ? Heart-healthy fats. Healthy fats called Omega-3 fatty acids are found in foods such as flaxseeds and coldwater fish, like sardines, salmon, and mackerel.  Limit how much you eat of the following: ? Canned or prepackaged foods. ? Food that is high in trans fat, such as fried foods. ? Food that is high in saturated fat, such as fatty meat. ? Sweets, desserts, sugary drinks, and other foods with added sugar. ? Full-fat dairy products.  Do not salt foods before eating.  Try to eat at least 2 vegetarian meals each week.  Eat more home-cooked food and less restaurant, buffet, and fast food.  When eating at a restaurant, ask that your food be prepared with less salt or no salt, if possible. What foods are recommended? The items listed may not be a complete list. Talk with your dietitian about what dietary choices are best for you. Grains Whole-grain or whole-wheat bread. Whole-grain or whole-wheat pasta. Brown rice. Orpah Cobb. Bulgur. Whole-grain and low-sodium cereals. Pita bread. Low-fat, low-sodium crackers. Whole-wheat flour tortillas. Vegetables Fresh or frozen vegetables (raw, steamed, roasted, or  grilled). Low-sodium or reduced-sodium tomato and vegetable juice. Low-sodium or reduced-sodium tomato sauce and tomato paste. Low-sodium or reduced-sodium canned vegetables. Fruits All fresh, dried, or frozen fruit. Canned fruit in natural juice (without added sugar). Meat and other protein foods Skinless chicken or [place]. Ground chicken or [place]. Pork with fat trimmed off. Fish and seafood. Egg whites. Dried beans, peas, or lentils. Unsalted nuts, nut butters, and seeds. Unsalted canned beans. Lean cuts of beef with fat trimmed off.  Low-sodium, lean deli meat. Dairy Low-fat (1%) or fat-free (skim) milk. Fat-free, low-fat, or reduced-fat cheeses. Nonfat, low-sodium ricotta or cottage cheese. Low-fat or nonfat yogurt. Low-fat, low-sodium cheese. Fats and oils Soft margarine without trans fats. Vegetable oil. Low-fat, reduced-fat, or light mayonnaise and salad dressings (reduced-sodium). Canola, safflower, olive, soybean, and sunflower oils. Avocado. Seasoning and other foods Herbs. Spices. Seasoning mixes without salt. Unsalted popcorn and pretzels. Fat-free sweets. What foods are not recommended? The items listed may not be a complete list. Talk with your dietitian about what dietary choices are best for you. Grains Baked goods made with fat, such as croissants, muffins, or some breads. Dry pasta or rice meal packs. Vegetables Creamed or fried vegetables. Vegetables in a cheese sauce. Regular canned vegetables (not low-sodium or reduced-sodium). Regular canned tomato sauce and paste (not low-sodium or reduced-sodium). Regular tomato and vegetable juice (not low-sodium or reduced-sodium). Rosita Fire. Olives. Fruits Canned fruit in a light or heavy syrup. Fried fruit. Fruit in cream or butter sauce. Meat and other protein foods Fatty cuts of meat. Ribs. Fried meat. Tomasa Blase. Sausage. Bologna and other processed lunch meats. Salami. Fatback. Hotdogs. Bratwurst. Salted nuts and seeds. Canned beans with added salt. Canned or smoked fish. Whole eggs or egg yolks. Chicken or [place] with skin. Dairy Whole or 2% milk, cream, and half-and-half. Whole or full-fat cream cheese. Whole-fat or sweetened yogurt. Full-fat cheese. Nondairy creamers. Whipped toppings. Processed cheese and cheese spreads. Fats and oils Butter. Stick margarine. Lard. Shortening. Ghee. Bacon fat. Tropical oils, such as coconut, palm kernel, or palm oil. Seasoning and other foods Salted popcorn and pretzels. Onion salt, garlic salt, seasoned salt, table salt, and sea  salt. Worcestershire sauce. Tartar sauce. Barbecue sauce. Teriyaki sauce. Soy sauce, including reduced-sodium. Steak sauce. Canned and packaged gravies. Fish sauce. Oyster sauce. Cocktail sauce. Horseradish that you find on the shelf. Ketchup. Mustard. Meat flavorings and tenderizers. Bouillon cubes. Hot sauce and Tabasco sauce. Premade or packaged marinades. Premade or packaged taco seasonings. Relishes. Regular salad dressings. Where to find more information:  National Heart, Lung, and Blood Institute: PopSteam.is  American Heart Association: www.heart.org Summary  The DASH eating plan is a healthy eating plan that has been shown to reduce high blood pressure (hypertension). It may also reduce your risk for type 2 diabetes, heart disease, and stroke.  With the DASH eating plan, you should limit salt (sodium) intake to 2,300 mg a day. If you have hypertension, you may need to reduce your sodium intake to 1,500 mg a day.  When on the DASH eating plan, aim to eat more fresh fruits and vegetables, whole grains, lean proteins, low-fat dairy, and heart-healthy fats.  Work with your health care provider or diet and nutrition specialist (dietitian) to adjust your eating plan to your individual calorie needs. This information is not intended to replace advice given to you by your health care provider. Make sure you discuss any questions you have with your health care provider. Document Revised: 10/02/2017 Document Reviewed: 10/13/2016 Elsevier Patient Education  2020 [employer] Managing Your Hypertension Hypertension is commonly called high blood pressure. This is when the force of your blood pressing against the walls of your arteries is too strong. Arteries are blood vessels that carry blood from your heart throughout your body. Hypertension forces the heart to work harder to pump blood, and may cause the arteries to become narrow or stiff. Having untreated or uncontrolled hypertension can  cause heart attack, stroke, kidney disease, and other problems. What are blood pressure readings? A blood pressure reading consists of a higher number over a lower number. Ideally, your blood pressure should be below 120/80. The first ("top") number is called the systolic pressure. It is a measure of the pressure in your arteries as your heart beats. The second ("bottom") number is called the diastolic pressure. It is a measure of the pressure in your arteries as the heart relaxes. What does my blood pressure reading mean? Blood pressure is classified into four stages. Based on your blood pressure reading, your health care provider may use the following stages to determine what type of treatment you need, if any. Systolic pressure and diastolic pressure are measured in a unit called mm Hg. Normal  Systolic pressure: below 120.  Diastolic pressure: below 80. Elevated  Systolic pressure: 120-129.  Diastolic pressure: below 80. Hypertension stage 1  Systolic pressure: 130-139.  Diastolic pressure: 80-89. Hypertension stage 2  Systolic pressure: 140 or above.  Diastolic pressure: 90 or above. What health risks are associated with hypertension? Managing your hypertension is an important responsibility. Uncontrolled hypertension can lead to:  A heart attack.  A stroke.  A weakened blood vessel (aneurysm).  Heart failure.  Kidney damage.  Eye damage.  Metabolic syndrome.  Memory and concentration problems. What changes can I make to manage my hypertension? Hypertension can be managed by making lifestyle changes and possibly by taking medicines. Your health care provider will help you make a plan to bring your blood pressure within a normal range. Eating and drinking   Eat a diet that is high in fiber and potassium, and low in salt (sodium), added sugar, and fat. An example eating plan is called the DASH (Dietary Approaches to Stop Hypertension) diet. To eat this way: ? Eat  plenty of fresh fruits and vegetables. Try to fill half of your plate at each meal with fruits and vegetables. ? Eat whole grains, such as whole wheat pasta, brown rice, or whole grain bread. Fill about one quarter of your plate with whole grains. ? Eat low-fat diary products. ? Avoid fatty cuts of meat, processed or cured meats, and poultry with skin. Fill about one quarter of your plate with lean proteins such as fish, chicken without skin, beans, eggs, and tofu. ? Avoid premade and processed foods. These tend to be higher in sodium, added sugar, and fat.  Reduce your daily sodium intake. Most people with hypertension should eat less than 1,500 mg of sodium a day.  Limit alcohol intake to no more than 1 drink a day for nonpregnant women and 2 drinks a day for men. One drink equals 12 oz of beer, 5 oz of wine, or 1 oz of hard liquor. Lifestyle  Work with your health care provider to maintain a healthy body weight, or to lose weight. Ask what an ideal weight is for you.  Get at least 30 minutes of exercise that causes your heart to beat faster (aerobic exercise) most days of the week. Activities may include walking, swimming, or  biking.  Include exercise to strengthen your muscles (resistance exercise), such as weight lifting, as part of your weekly exercise routine. Try to do these types of exercises for 30 minutes at least 3 days a week.  Do not use any products that contain nicotine or tobacco, such as cigarettes and e-cigarettes. If you need help quitting, ask your health care provider.  Control any long-term (chronic) conditions you have, such as high cholesterol or diabetes. Monitoring  Monitor your blood pressure at home as told by your health care provider. Your personal target blood pressure may vary depending on your medical conditions, your age, and other factors.  Have your blood pressure checked regularly, as often as told by your health care provider. Working with your health  care provider  Review all the medicines you take with your health care provider because there may be side effects or interactions.  Talk with your health care provider about your diet, exercise habits, and other lifestyle factors that may be contributing to hypertension.  Visit your health care provider regularly. Your health care provider can help you create and adjust your plan for managing hypertension. Will I need medicine to control my blood pressure? Your health care provider may prescribe medicine if lifestyle changes are not enough to get your blood pressure under control, and if:  Your systolic blood pressure is 130 or higher.  Your diastolic blood pressure is 80 or higher. Take medicines only as told by your health care provider. Follow the directions carefully. Blood pressure medicines must be taken as prescribed. The medicine does not work as well when you skip doses. Skipping doses also puts you at risk for problems. Contact a health care provider if:  You think you are having a reaction to medicines you have taken.  You have repeated (recurrent) headaches.  You feel dizzy.  You have swelling in your ankles.  You have trouble with your vision. Get help right away if:  You develop a severe headache or confusion.  You have unusual weakness or numbness, or you feel faint.  You have severe pain in your chest or abdomen.  You vomit repeatedly.  You have trouble breathing. Summary  Hypertension is when the force of blood pumping through your arteries is too strong. If this condition is not controlled, it may put you at risk for serious complications.  Your personal target blood pressure may vary depending on your medical conditions, your age, and other factors. For most people, a normal blood pressure is less than 120/80.  Hypertension is managed by lifestyle changes, medicines, or both. Lifestyle changes include weight loss, eating a healthy, low-sodium diet,  exercising more, and limiting alcohol. This information is not intended to replace advice given to you by your health care provider. Make sure you discuss any questions you have with your health care provider. Document Revised: 02/11/2019 Document Reviewed: 09/17/2016 Elsevier Patient Education  2020 [employer] High Cholesterol  High cholesterol is a condition in which the blood has high levels of a white, waxy, fat-like substance (cholesterol). The human body needs small amounts of cholesterol. The liver makes all the cholesterol that the body needs. Extra (excess) cholesterol comes from the food that we eat. Cholesterol is carried from the liver by the blood through the blood vessels. If you have high cholesterol, deposits (plaques) may build up on the walls of your blood vessels (arteries). Plaques make the arteries narrower and stiffer. Cholesterol plaques increase your risk for heart attack and stroke. Work  with your health care provider to keep your cholesterol levels in a healthy range. What increases the risk? This condition is more likely to develop in people who:  Eat foods that are high in animal fat (saturated fat) or cholesterol.  Are overweight.  Are not getting enough exercise.  Have a family history of high cholesterol. What are the signs or symptoms? There are no symptoms of this condition. How is this diagnosed? This condition may be diagnosed from the results of a blood test.  If you are older than age 54, your health care provider may check your cholesterol every 4-6 years.  You may be checked more often if you already have high cholesterol or other risk factors for heart disease. The blood test for cholesterol measures:  "Bad" cholesterol (LDL cholesterol). This is the main type of cholesterol that causes heart disease. The desired level for LDL is less than 100.  "Good" cholesterol (HDL cholesterol). This type helps to protect against heart disease by cleaning  the arteries and carrying the LDL away. The desired level for HDL is 60 or higher.  Triglycerides. These are fats that the body can store or burn for energy. The desired number for triglycerides is lower than 150.  Total cholesterol. This is a measure of the total amount of cholesterol in your blood, including LDL cholesterol, HDL cholesterol, and triglycerides. A healthy number is less than 200. How is this treated? This condition is treated with diet changes, lifestyle changes, and medicines. Diet changes  This may include eating more whole grains, fruits, vegetables, nuts, and fish.  This may also include cutting back on red meat and foods that have a lot of added sugar. Lifestyle changes  Changes may include getting at least 40 minutes of aerobic exercise 3 times a week. Aerobic exercises include walking, biking, and swimming. Aerobic exercise along with a healthy diet can help you maintain a healthy weight.  Changes may also include quitting smoking. Medicines  Medicines are usually given if diet and lifestyle changes have failed to reduce your cholesterol to healthy levels.  Your health care provider may prescribe a statin medicine. Statin medicines have been shown to reduce cholesterol, which can reduce the risk of heart disease. Follow these instructions at home: Eating and drinking If told by your health care provider:  Eat chicken (without skin), fish, veal, shellfish, ground [place] breast, and round or loin cuts of red meat.  Do not eat fried foods or fatty meats, such as hot dogs and salami.  Eat plenty of fruits, such as apples.  Eat plenty of vegetables, such as broccoli, potatoes, and carrots.  Eat beans, peas, and lentils.  Eat grains such as barley, rice, couscous, and bulgur wheat.  Eat pasta without cream sauces.  Use skim or nonfat milk, and eat low-fat or nonfat yogurt and cheeses.  Do not eat or drink whole milk, cream, ice cream, egg yolks, or hard  cheeses.  Do not eat stick margarine or tub margarines that contain trans fats (also called partially hydrogenated oils).  Do not eat saturated tropical oils, such as coconut oil and palm oil.  Do not eat cakes, cookies, crackers, or other baked goods that contain trans fats.  General instructions  Exercise as directed by your health care provider. Increase your activity level with activities such as gardening, walking, and taking the stairs.  Take over-the-counter and prescription medicines only as told by your health care provider.  Do not use any products that contain  nicotine or tobacco, such as cigarettes and e-cigarettes. If you need help quitting, ask your health care provider.  Keep all follow-up visits as told by your health care provider. This is important. Contact a health care provider if:  You are struggling to maintain a healthy diet or weight.  You need help to start on an exercise program.  You need help to stop smoking. Get help right away if:  You have chest pain.  You have trouble breathing. This information is not intended to replace advice given to you by your health care provider. Make sure you discuss any questions you have with your health care provider. Document Revised: 10/23/2017 Document Reviewed: 04/19/2016 Elsevier Patient Education  2020 [employer]. Allergic Rhinitis, Adult Allergic rhinitis is an allergic reaction that affects the mucous membrane inside the nose. It causes sneezing, a runny or stuffy nose, and the feeling of mucus going down the back of the throat (postnasal drip). Allergic rhinitis can be mild to severe. There are two types of allergic rhinitis:  Seasonal. This type is also called hay fever. It happens only during certain seasons.  Perennial. This type can happen at any time of the year. What are the causes? This condition happens when the body's defense system (immune system) responds to certain harmless substances called  allergens as though they were germs.  Seasonal allergic rhinitis is triggered by pollen, which can come from grasses, trees, and weeds. Perennial allergic rhinitis may be caused by:  House dust mites.  Pet dander.  Mold spores. What are the signs or symptoms? Symptoms of this condition include:  Sneezing.  Runny or stuffy nose (nasal congestion).  Postnasal drip.  Itchy nose.  Tearing of the eyes.  Trouble sleeping.  Daytime sleepiness. How is this diagnosed? This condition may be diagnosed based on:  Your medical history.  A physical exam.  Tests to check for related conditions, such as: ? Asthma. ? Pink eye. ? Ear infection. ? Upper respiratory infection.  Tests to find out which allergens trigger your symptoms. These may include skin or blood tests. How is this treated? There is no cure for this condition, but treatment can help control symptoms. Treatment may include:  Taking medicines that block allergy symptoms, such as antihistamines. Medicine may be given as a shot, nasal spray, or pill.  Avoiding the allergen.  Desensitization. This treatment involves getting ongoing shots until your body becomes less sensitive to the allergen. This treatment may be done if other treatments do not help.  If taking medicine and avoiding the allergen does not work, new, stronger medicines may be prescribed. Follow these instructions at home:  Find out what you are allergic to. Common allergens include smoke, dust, and pollen.  Avoid the things you are allergic to. These are some things you can do to help avoid allergens: ? Replace carpet with wood, tile, or vinyl flooring. Carpet can trap dander and dust. ? Do not smoke. Do not allow smoking in your home. ? Change your heating and air conditioning filter at least once a month. ? During allergy season:  Keep windows closed as much as possible.  Plan outdoor activities when pollen counts are lowest. This is usually  during the evening hours.  When coming indoors, change clothing and shower before sitting on furniture or bedding.  Take over-the-counter and prescription medicines only as told by your health care provider.  Keep all follow-up visits as told by your health care provider. This is important. Contact a  health care provider if:  You have a fever.  You develop a persistent cough.  You make whistling sounds when you breathe (you wheeze).  Your symptoms interfere with your normal daily activities. Get help right away if:  You have shortness of breath. Summary  This condition can be managed by taking medicines as directed and avoiding allergens.  Contact your health care provider if you develop a persistent cough or fever.  During allergy season, keep windows closed as much as possible. This information is not intended to replace advice given to you by your health care provider. Make sure you discuss any questions you have with your health care provider. Document Revised: 10/02/2017 Document Reviewed: 11/27/2016 Elsevier Patient Education  2020 [employer].

## 2020-01-04 NOTE — Progress Notes (Signed)
Subjective:    Patient ID: Tony Alvarado, male    DOB: 1966-07-24, 54 y.o.   MRN: [id number]  53y/o caucasian male established patient notes headaches triggered with shoulder shrugs/lifting heavier weights and using neck/shoulders to assist lift  Denied loss of bowel/bladder control, saddle paresthesias, arm/leg weakness.  Neurology [facility] requested repeat B12 and vitamin D in 6 months after visit Oct 2020 B12 228 at that visit and prior 318 and Vitamin D 31 previous 23 per chart review Epic.  Covid positive test late December with [facility] (provider doesn't use epic) symptoms achy body aches, chills, tired x 3-4 days treated with theraflu; cough and tight chest day 4 did teledoc woke up next morning and symptoms resolved improved activity   Denied fever  Then loss of taste and smell persisting.  Dot due Nov 2021  Still on baclofen daily for headaches 10mg  up to BID.  Lipids low HDL and elevated trigs/LDL last physical 09/21/2018 BP 140/94 pulse 67 weight 264 lbs on chart review TDAP 2012 ua normal headaches continue baclofen prn and neurology f/u, wt loss/increase fiber BMI 32.99, low HDL repeat lipids 1 year exercise 150 minutes per week decrease added sugars in diet.  Patient reported since that office visit he has been riding bike, lifting weights, circuit training.  Patient reports goal to lose 25 lbs and decrease to 225lbs   Seasonal allergies history of singulair, flonase use and wants flonase refill allegra OTC for antihistamine now as he switches it up/finds that using one stops working after a while; he reports possible ?gallstone attack epigastric and wrapped around RUQ stopped fried foods and fat for 1 week and no further symptoms taking milk thistle supplement and fiber and pain resolved no further concerns; hx  GERD denied cough/voice changes/dyphagia/dysphasia;  Colonoscopy 03/2017 next due 2028  Review [facility] [place] paper chart: PMHx seasonal rhinitis (had injections for a while), left  lateral epicondylitis, chest pain 03/2013, GERD, chronic headaches Rx elavil 10mg  po qhs 2017, flonase 2013, 2018-2020; azelastine 2015; loratadine, zyrtec, mucinex alternates; valtrex 2018-2019, singulair 2018-2020; allegra D 2010;  labs [phone] highest glucose 103 lowest 66; cholesterol 183 high 155 low; trigs 66-171 HDL 42-52 LDL 83-109 hgb/hct 13.2-15.1/37.9-43 09/2017 glucose 90 trigs 108 HDL 48 LDL 91 09/2018 labs glucose 103 total chol 152 trigs 176 HDL 39 LDL 78TSH 3.55 PSA 1.0 Hgb Hct 13.8/39.4 decreased gym time 8 lb weight gain; elevated glucose decreased HDL and increased triglycerides had banana & pecans prior to these labs nonfasting encouraged weight loss/exercise/fiber     Review of Systems  Constitutional: Negative for activity change, appetite change, chills, diaphoresis, fatigue, fever and unexpected weight change.  HENT: Negative for congestion, ear discharge, ear pain, facial swelling, mouth sores, nosebleeds, postnasal drip, rhinorrhea, sinus pressure, sinus pain, trouble swallowing and voice change.   Eyes: Negative for photophobia and visual disturbance.  Respiratory: Negative for cough, shortness of breath, wheezing and stridor.   Cardiovascular: Negative for chest pain, palpitations and leg swelling.  Gastrointestinal: Positive for abdominal pain. Negative for abdominal distention, constipation, diarrhea, nausea and vomiting.  Endocrine: Negative for cold intolerance and heat intolerance.  Genitourinary: Negative for difficulty urinating.  Musculoskeletal: Positive for myalgias, neck pain and neck stiffness. Negative for arthralgias, back pain, gait problem and joint swelling.  Skin: Negative for rash.  Allergic/Immunologic: Positive for environmental allergies. Negative for food allergies.  Neurological: Positive for headaches. Negative for dizziness, tremors, seizures, syncope, facial asymmetry, speech difficulty, weakness, light-headedness and numbness.  Hematological: Negative for adenopathy. Does not bruise/bleed easily.  Psychiatric/Behavioral: Negative for agitation, confusion and sleep disturbance.       Objective:   Physical Exam Vitals and nursing note reviewed.  Constitutional:      General: He is awake. He is not in acute distress.    Appearance: Normal appearance. He is well-developed and well-groomed. He is obese. He is not ill-appearing, toxic-appearing or diaphoretic.  HENT:     Head: Normocephalic and atraumatic.     Jaw: There is normal jaw occlusion.     Salivary Glands: Right salivary gland is not diffusely enlarged or tender. Left salivary gland is not diffusely enlarged or tender.     Right Ear: Hearing, tympanic membrane, ear canal and external ear normal. There is no impacted cerumen.     Left Ear: Hearing, tympanic membrane, ear canal and external ear normal. There is no impacted cerumen.     Nose: Nose normal.     Right Sinus: No maxillary sinus tenderness or frontal sinus tenderness.     Left Sinus: No maxillary sinus tenderness or frontal sinus tenderness.     Mouth/Throat:     Lips: Pink. No lesions.     Mouth: Mucous membranes are moist. No lacerations, oral lesions or angioedema.     Dentition: No gum lesions.     Tongue: No lesions. Tongue does not deviate from midline.     Palate: No mass and lesions.     Pharynx: Oropharynx is clear. Uvula midline. No pharyngeal swelling, oropharyngeal exudate, posterior oropharyngeal erythema or uvula swelling.     Tonsils: No tonsillar exudate. 0 on the right. 0 on the left.  Eyes:     General: Lids are normal. Vision grossly intact. Gaze aligned appropriately. No allergic shiner, visual field deficit or scleral icterus.       Right eye: No discharge.        Left eye: No discharge.     Extraocular Movements: Extraocular movements intact.     Conjunctiva/sclera: Conjunctivae normal.     Pupils: Pupils are equal, round, and reactive to light.  Neck:     Thyroid: No  thyroid mass, thyromegaly or thyroid tenderness.     Trachea: Trachea normal.  Cardiovascular:     Rate and Rhythm: Normal rate and regular rhythm.     Pulses: Normal pulses.          Radial pulses are 2+ on the right side and 2+ on the left side.     Heart sounds: Normal heart sounds, S1 normal and S2 normal. Heart sounds not distant. No murmur.  Pulmonary:     Effort: Pulmonary effort is normal. No respiratory distress.     Breath sounds: Normal breath sounds and air entry. No stridor, decreased air movement or transmitted upper airway sounds. No decreased breath sounds, wheezing, rhonchi or rales.     Comments: Wearing cloth mask due to covid 19 pandemic; patient frequently adjusting mask sliding below nose/mouth; spoke full sentences without difficulty; no cough observed in exam room;  Abdominal:     General: Abdomen is flat. Bowel sounds are normal. There is no distension.     Palpations: Abdomen is soft. There is no shifting dullness, fluid wave, hepatomegaly, splenomegaly, mass or pulsatile mass.     Tenderness: There is no abdominal tenderness. There is no right CVA tenderness, left CVA tenderness, guarding or rebound. Negative signs include Murphy's sign.     Hernia: There is no hernia in the umbilical area.  Comments: Dull to percussion x 4 quads; normoactive bowel sounds x 4 quads  Musculoskeletal:        General: No swelling, tenderness, deformity or signs of injury. Normal range of motion.     Right shoulder: Normal.     Left shoulder: Normal.     Right elbow: Normal.     Left elbow: Normal.     Right hand: Normal.     Left hand: Normal.     Cervical back: Normal, normal range of motion and neck supple. No swelling, edema, deformity, erythema, signs of trauma, lacerations, rigidity, spasms, torticollis, tenderness, bony tenderness or crepitus. No pain with movement, spinous process tenderness or muscular tenderness. Normal range of motion.     Thoracic back: Normal.      Lumbar back: Normal.     Right hip: Normal.     Left hip: Normal.     Right knee: Normal.     Left knee: Normal.     Right lower leg: No edema.     Left lower leg: No edema.  Lymphadenopathy:     Head:     Right side of head: No submental, submandibular, tonsillar or preauricular adenopathy.     Left side of head: No submental, submandibular, tonsillar or preauricular adenopathy.     Cervical: No cervical adenopathy.     Right cervical: No superficial cervical adenopathy.    Left cervical: No superficial cervical adenopathy.  Skin:    General: Skin is warm and dry.     Capillary Refill: Capillary refill takes less than 2 seconds.     Coloration: Skin is not ashen, cyanotic, jaundiced, mottled, pale or sallow.     Findings: No abrasion, abscess, acne, bruising, burn, ecchymosis, erythema, signs of injury, laceration, lesion, petechiae, rash or wound.     Nails: There is no clubbing.  Neurological:     General: No focal deficit present.     Mental Status: He is alert and oriented to person, place, and time. Mental status is at baseline.     Cranial Nerves: Cranial nerves are intact. No cranial nerve deficit, dysarthria or facial asymmetry.     Sensory: Sensation is intact. No sensory deficit.     Motor: Motor function is intact. No weakness, tremor, atrophy, abnormal muscle tone or seizure activity.     Coordination: Coordination is intact. Coordination normal.     Gait: Gait is intact. Gait normal.     Comments: Gait sure and steady in clinic; on/off exam table and in/out of chair without difficulty; bilateral hand grasp equal 5/5  Psychiatric:        Attention and Perception: Attention and perception normal.        Mood and Affect: Mood and affect normal.        Speech: Speech normal.        Behavior: Behavior normal. Behavior is cooperative.        Thought Content: Thought content normal.        Cognition and Memory: Cognition and memory normal.        Judgment: Judgment normal.      Results discussed in detail with patient at office visit given printed copy of results and results note and compared to previous results on file in paper chart with patient.  Follow up elevated LDL 102. Plan to give high fiber diet, mediteranean or dash diet and cholesterol handouts at appt; BP and BMI elevated above recommended levels blood pressure greater than 110s/60s and height  weight ratio of 25 I recommend weight loss, exercise 150 minutes per week; dietary fiber 30 grams per day men; eat whole grains/fruits/vegetables; keep added sugars to less than 150 calories/9 teaspoons for men per American Heart Association; prostate, electrolytes, iron, kidney/liver function, glucose, thyroid and complete blood count normal   Patient verbalized understanding information/instructions, agreed with plan of care and had no further questions at this time.  Results for Tony Alvarado, Tony Alvarado (MRN [id number]) as of 01/06/2020 00:57  Ref. Range 12/29/2019 08:26  Sodium Latest Ref Range: 134 - 144 mmol/L 138  Potassium Latest Ref Range: 3.5 - 5.2 mmol/L 4.5  Chloride Latest Ref Range: 96 - 106 mmol/L 101  Glucose Latest Ref Range: 65 - 99 mg/dL 99  BUN Latest Ref Range: 6 - 24 mg/dL 13  Creatinine Latest Ref Range: 0.76 - 1.27 mg/dL 0.10  Calcium Latest Ref Range: 8.7 - 10.2 mg/dL 9.5  BUN/Creatinine Ratio Latest Ref Range: 9 - 20  10  Phosphorus Latest Ref Range: 2.8 - 4.1 mg/dL 3.1  Alkaline Phosphatase Latest Ref Range: 39 - 117 IU/L 62  Albumin Latest Ref Range: 3.8 - 4.9 g/dL 4.5  Albumin/Globulin Ratio Latest Ref Range: 1.2 - 2.2  2.3 (H)  Uric Acid Latest Ref Range: 3.8 - 8.4 mg/dL 5.7  AST Latest Ref Range: 0 - 40 IU/L 20  ALT Latest Ref Range: 0 - 44 IU/L 27  Total Protein Latest Ref Range: 6.0 - 8.5 g/dL 6.5  Total Bilirubin Latest Ref Range: 0.0 - 1.2 mg/dL 0.7  GGT Latest Ref Range: 0 - 65 IU/L 16  GFR, Est Non African American Latest Ref Range: >59 mL/min/1.73 65  GFR, Est African American  Latest Ref Range: >59 mL/min/1.73 75  Estimated CHD Risk Latest Ref Range: 0.0 - 1.0 times avg. 0.6  LDH Latest Ref Range: 121 - 224 IU/L 170  Total CHOL/HDL Ratio Latest Ref Range: 0.0 - 5.0 ratio 3.5  Cholesterol, Total Latest Ref Range: 100 - 199 mg/dL 932  HDL Cholesterol Latest Ref Range: >39 mg/dL 48  Triglycerides Latest Ref Range: 0 - 149 mg/dL 355  VLDL Cholesterol Cal Latest Ref Range: 5 - 40 mg/dL 19  LDL Chol Calc (NIH) Latest Ref Range: 0 - 99 mg/dL 732 (H)  Iron Latest Ref Range: 38 - 169 ug/dL 202  Globulin, Total Latest Ref Range: 1.5 - 4.5 g/dL 2.0  WBC Latest Ref Range: 3.4 - 10.8 x10E3/uL 4.2  RBC Latest Ref Range: 4.14 - 5.80 x10E6/uL 4.60  Hemoglobin Latest Ref Range: 13.0 - 17.7 g/dL 54.2  HCT Latest Ref Range: 37.5 - 51.0 % 41.5  MCV Latest Ref Range: 79 - 97 fL 90  MCH Latest Ref Range: 26.6 - 33.0 pg 31.7  MCHC Latest Ref Range: 31.5 - 35.7 g/dL 70.6  RDW Latest Ref Range: 11.6 - 15.4 % 12.4  Platelets Latest Ref Range: 150 - 450 x10E3/uL 227  Neutrophils Latest Ref Range: Not Estab. % 56  Immature Granulocytes Latest Ref Range: Not Estab. % 0  NEUT# Latest Ref Range: 1.4 - 7.0 x10E3/uL 2.4  Lymphocyte # Latest Ref Range: 0.7 - 3.1 x10E3/uL 1.3  Monocytes Absolute Latest Ref Range: 0.1 - 0.9 x10E3/uL 0.4  Basophils Absolute Latest Ref Range: 0.0 - 0.2 x10E3/uL 0.0  Immature Grans (Abs) Latest Ref Range: 0.0 - 0.1 x10E3/uL 0.0  Lymphs Latest Ref Range: Not Estab. % 31  Monocytes Latest Ref Range: Not Estab. % 9  Basos Latest Ref Range: Not Estab. %  1  Eos Latest Ref Range: Not Estab. % 3  EOS (ABSOLUTE) Latest Ref Range: 0.0 - 0.4 x10E3/uL 0.1  TSH Latest Ref Range: 0.450 - 4.500 uIU/mL 2.110  Thyroxine (T4) Latest Ref Range: 4.5 - 12.0 ug/dL 6.6  Free Thyroxine Index Latest Ref Range: 1.2 - 4.9  1.9  T3 Uptake Ratio Latest Ref Range: 24 - 39 % 29  Prostate Specific Ag, Serum Latest Ref Range: 0.0 - 4.0 ng/mL 1.3  Results for Tony Alvarado, Tony Alvarado (MRN  [id number]) as of 01/06/2020 00:58  Ref. Range 12/29/2019 08:40  Bilirubin, UA Unknown negative  Clarity, UA Unknown clear  Color, UA Unknown yellow  Glucose Latest Ref Range: Negative  Negative  Ketones, UA Unknown negative  Leukocytes,UA Latest Ref Range: Negative  Negative  Nitrite, UA Unknown negative  pH, UA Latest Ref Range: 5.0 - 8.0  7.0  Protein,UA Latest Ref Range: Negative  Negative  Specific Gravity, UA Latest Ref Range: 1.010 - 1.025  1.020  Urobilinogen, UA Latest Ref Range: 0.2 or 1.0 E.U./dL 0.2  RBC, UA Unknown negative       Assessment & Plan:  A-annual physical, essential hypertension, BMI 32.89, chronic headache, seasonal allergic rhinitis,  history of low vitamin D, history low B12  P-DOT due Nov 2021 schedule follow; possible gallbladder issues pain resolved with diet modification.  Exitcare handout printed and given on gallbladder diet. Discussed gallbladder stones/cholecystitis symptoms, possible risks and treatment (surgery/diet) with patient.  He is aware if pain reoccurs/worsening pain/vomiting to seek same day re-evaluation with a provider as risk of rupture if duct blocked by stone. Neurology requested vitamin D and B12 level ordered for patient nonfasting due this month (6 month follow up from Oct 2020 visit)  Denied health concerns at this time and going to work on further weight loss.  Next routine physical in 1 year.  Repeat male executive panel fasting. Discussed covid vaccination recommended in 3 months; no preference as to manufacturer.  Current studies show at 3 months after symptomatic covid 90% patients typically still have antibodies and not seeing reinfection typically prior to 90 days but has been reported after 90 days post infection.  [place] studied showed serum antibodies 80% of patients tested at 5 month mark.  Discussed health dept [place], [place] and federal govt starting vaccination site in [place] next week 3K shots per day.  Continue social  distancing, wearing mask and washing hands. Patient verbalized understanding information/instructions, agreed with plan of care and had no further questions at this time.  Discussed weight loss/exercise  BP above goal 110-120s/60-70s  Increase lisinopril to 10mg  po qam and 5mg  po qpm electronic Rx to his pharmacy of choice #[id number] RF3   Continue to monitor blood pressure at home and maintain log of blood pressure and pulse to bring to follow up appointments. Continue low sodium/DASH diet and exercise program 150 minutes exercise/activity per week.  Recommended weight loss/weight maintenance to BMI 20-25. Return to the clinic if any new symptoms/notify clinic staff if visual changes, frequent headache, chest pain or dyspnea on mild or  minimal exertion. Exitcare handout on managing hypertension, dash diet, high fiber diet, gallbladder diet printed and given to patient.  Patient verbalized agreement and understanding of treatment plan and had no further questions at this time. P2: Diet and Exercise specific for HTN  Continue baclofen 10mg  po BID prn for headaches.  Daily stretches  exitcare handout on tension headaches and muscle spasms printed and given to patient.   If  changes in frequency/severity or new visual changes, arm/leg weakness, dysphagia, dysphasia follow up for same day re-evaluation.  Patient verbalized understanding information/instructions, agreed with plan of care and had no further questions at this time.  Patient may use normal saline nasal spray 2 sprays each nostril q2h wa as needed. Refilled flonase 50mcg 1 spray each nostril daily #16gm RF5.  Patient denied personal or family history of ENT cancer.  OTC antihistamine of choice allegra 180mg  po daily.  Avoid triggers if possible.  Shower prior to bedtime if exposed to triggers.  If allergic dust/dust mites recommend mattress/pillow covers/encasements; washing linens, vacuuming, sweeping, dusting weekly.  Call or return to clinic as needed if  these symptoms worsen or fail to improve as anticipated.   Exitcare handout on allergic rhinitis printed and given to patient.  Patient verbalized understanding of instructions, agreed with plan of care and had no further questions at this time.  P2:  Avoidance and hand washing.  Hyperlipidemia exitcare handout on high cholesterol printed and given to patient.  Discussed lifestyle modification re diet to decrease white starches/sugars e.g. potatoes/bread increase omega 3 sources nuts, fish options add more fruits and vegetables and lean protein choices such as hummus or yogurt. Discussed weight loss. Patient agreed with plan of care and verbalized understanding of information/instructions and had no further questions at this time.   Discussed with patient long covid symptoms affecting some patients.  Most patients with return of loss taste/smell by 3 month post infection.  Patient reporting sometimes can taste or smell but not as was prior to covid infection.  Will continue to monitor if no improvement or worsening consider neurology referral.  Patient verbalized understanding information/instructions, agreed with plan of care and had no further questions at this time.

## 2020-01-06 DIAGNOSIS — E538 Deficiency of other specified B group vitamins: Secondary | ICD-10-CM | POA: Insufficient documentation

## 2020-01-06 DIAGNOSIS — G44221 Chronic tension-type headache, intractable: Secondary | ICD-10-CM | POA: Insufficient documentation

## 2020-01-06 DIAGNOSIS — Z6832 Body mass index (BMI) 32.0-32.9, adult: Secondary | ICD-10-CM | POA: Insufficient documentation

## 2020-01-06 DIAGNOSIS — J301 Allergic rhinitis due to pollen: Secondary | ICD-10-CM | POA: Insufficient documentation

## 2020-01-06 DIAGNOSIS — R7989 Other specified abnormal findings of blood chemistry: Secondary | ICD-10-CM | POA: Insufficient documentation

## 2020-01-06 NOTE — Progress Notes (Signed)
Please schedule patient for lab B12 and vitamin D requested by neurology per Albina Billet, PA-C

## 2020-01-09 ENCOUNTER — Other Ambulatory Visit: Payer: Self-pay

## 2020-01-09 DIAGNOSIS — E538 Deficiency of other specified B group vitamins: Secondary | ICD-10-CM

## 2020-01-09 DIAGNOSIS — R7989 Other specified abnormal findings of blood chemistry: Secondary | ICD-10-CM

## 2020-01-09 NOTE — Progress Notes (Signed)
Presents for Vitamin D & Vitamin B12 levels as ordered by Ilean Skill, NP-C (Interim Provider) on 01/06/20.  AMD

## 2020-01-10 LAB — VITAMIN D 25 HYDROXY (VIT D DEFICIENCY, FRACTURES): Vit D, 25-Hydroxy: 53.7 ng/mL (ref 30.0–100.0)

## 2020-01-10 LAB — VITAMIN B12: Vitamin B-12: 583 pg/mL (ref 232–1245)

## 2020-01-16 ENCOUNTER — Other Ambulatory Visit: Payer: Self-pay

## 2020-01-16 ENCOUNTER — Ambulatory Visit: Payer: Self-pay | Admitting: Physician Assistant

## 2020-01-16 VITALS — BP 126/90 | HR 64 | Temp 97.2°F | Wt 252.0 lb

## 2020-01-16 DIAGNOSIS — J301 Allergic rhinitis due to pollen: Secondary | ICD-10-CM

## 2020-01-16 MED ORDER — METHYLPREDNISOLONE ACETATE 40 MG/ML IJ SUSP
40.0000 mg | Freq: Once | INTRAMUSCULAR | Status: AC
  Administered 2020-01-16: 40 mg via INTRAMUSCULAR

## 2020-01-16 NOTE — Progress Notes (Signed)
   Subjective: Seasonal rhinitis    Patient ID: Tony Alvarado, male    DOB: 1966/08/15, 54 y.o.   MRN: [id number]  HPI Patient requests injection of Depo-Medrol for seasonal rhinitis.  Patient continued to use Flonase and Allegra.   Review of Systems    Allergic rhinitis and hypertension Objective:   Physical Exam HEENT remarkable edematous nasal turbinates with clear rhinorrhea.       Assessment & Plan: Seasonal rhinitis  Continue previous medication follow-up as necessary.

## 2020-01-16 NOTE — Addendum Note (Signed)
Addended by: Gardner Candle on: 01/16/2020 12:19 PM   Modules accepted: Orders

## 2020-01-16 NOTE — Progress Notes (Signed)
   Subjective:    Patient ID: Tony Alvarado, male    DOB: 12-13-1965, 54 y.o.   MRN: [id number]  HPI    Review of Systems     Objective:   Physical Exam        Assessment & Plan:

## 2020-01-20 DIAGNOSIS — H5203 Hypermetropia, bilateral: Secondary | ICD-10-CM | POA: Diagnosis not present

## 2020-07-27 ENCOUNTER — Other Ambulatory Visit: Payer: Self-pay | Admitting: Internal Medicine

## 2020-07-27 DIAGNOSIS — I10 Essential (primary) hypertension: Secondary | ICD-10-CM

## 2020-08-16 NOTE — Progress Notes (Signed)
Scheduled to complete physical 08/28/20 with Durward Parcel, PA-C.

## 2020-08-17 ENCOUNTER — Other Ambulatory Visit: Payer: Self-pay

## 2020-08-17 ENCOUNTER — Ambulatory Visit: Payer: Self-pay

## 2020-08-17 DIAGNOSIS — Z0000 Encounter for general adult medical examination without abnormal findings: Secondary | ICD-10-CM

## 2020-08-17 LAB — POCT URINALYSIS DIPSTICK
Bilirubin, UA: NEGATIVE
Blood, UA: NEGATIVE
Glucose, UA: NEGATIVE
Ketones, UA: NEGATIVE
Leukocytes, UA: NEGATIVE
Nitrite, UA: NEGATIVE
Protein, UA: NEGATIVE
Spec Grav, UA: 1.02 (ref 1.010–1.025)
Urobilinogen, UA: 0.2 E.U./dL
pH, UA: 6 (ref 5.0–8.0)

## 2020-08-18 LAB — CMP12+LP+TP+TSH+6AC+PSA+CBC…
ALT: 21 IU/L (ref 0–44)
AST: 22 IU/L (ref 0–40)
Albumin/Globulin Ratio: 2.4 — ABNORMAL HIGH (ref 1.2–2.2)
Albumin: 4.8 g/dL (ref 3.8–4.9)
Alkaline Phosphatase: 59 IU/L (ref 44–121)
BUN/Creatinine Ratio: 17 (ref 9–20)
BUN: 17 mg/dL (ref 6–24)
Basophils Absolute: 0 10*3/uL (ref 0.0–0.2)
Basos: 1 %
Bilirubin Total: 0.9 mg/dL (ref 0.0–1.2)
Calcium: 9.2 mg/dL (ref 8.7–10.2)
Chloride: 101 mmol/L (ref 96–106)
Chol/HDL Ratio: 3.4 ratio (ref 0.0–5.0)
Cholesterol, Total: 174 mg/dL (ref 100–199)
Creatinine, Ser: 1.03 mg/dL (ref 0.76–1.27)
EOS (ABSOLUTE): 0.1 10*3/uL (ref 0.0–0.4)
Eos: 3 %
Estimated CHD Risk: 0.5 times avg. (ref 0.0–1.0)
Free Thyroxine Index: 1.6 (ref 1.2–4.9)
GFR calc Af Amer: 95 mL/min/{1.73_m2} (ref >59)
GFR calc non Af Amer: 82 mL/min/{1.73_m2} (ref >59)
GGT: 15 IU/L (ref 0–65)
Globulin, Total: 2 g/dL (ref 1.5–4.5)
Glucose: 94 mg/dL (ref 65–99)
HDL: 51 mg/dL (ref >39)
Hematocrit: 41.8 % (ref 37.5–51.0)
Hemoglobin: 14.5 g/dL (ref 13.0–17.7)
Immature Grans (Abs): 0 10*3/uL (ref 0.0–0.1)
Immature Granulocytes: 0 %
Iron: 192 ug/dL — ABNORMAL HIGH (ref 38–169)
LDH: 188 IU/L (ref 121–224)
LDL Chol Calc (NIH): 105 mg/dL — ABNORMAL HIGH (ref 0–99)
Lymphocytes Absolute: 1.2 10*3/uL (ref 0.7–3.1)
Lymphs: 30 %
MCH: 31.3 pg (ref 26.6–33.0)
MCHC: 34.7 g/dL (ref 31.5–35.7)
MCV: 90 fL (ref 79–97)
Monocytes Absolute: 0.3 10*3/uL (ref 0.1–0.9)
Monocytes: 9 %
Neutrophils Absolute: 2.3 10*3/uL (ref 1.4–7.0)
Neutrophils: 57 %
Phosphorus: 2.9 mg/dL (ref 2.8–4.1)
Platelets: 242 10*3/uL (ref 150–450)
Potassium: 4.9 mmol/L (ref 3.5–5.2)
Prostate Specific Ag, Serum: 1.3 ng/mL (ref 0.0–4.0)
RBC: 4.63 x10E6/uL (ref 4.14–5.80)
RDW: 12.5 % (ref 11.6–15.4)
Sodium: 138 mmol/L (ref 134–144)
T3 Uptake Ratio: 27 % (ref 24–39)
T4, Total: 6 ug/dL (ref 4.5–12.0)
TSH: 1.77 u[IU]/mL (ref 0.450–4.500)
Total Protein: 6.8 g/dL (ref 6.0–8.5)
Triglycerides: 96 mg/dL (ref 0–149)
Uric Acid: 5.8 mg/dL (ref 3.8–8.4)
VLDL Cholesterol Cal: 18 mg/dL (ref 5–40)
WBC: 4 10*3/uL (ref 3.4–10.8)

## 2020-08-28 ENCOUNTER — Other Ambulatory Visit: Payer: Self-pay

## 2020-08-28 ENCOUNTER — Encounter: Payer: Self-pay | Admitting: Physician Assistant

## 2020-08-28 ENCOUNTER — Ambulatory Visit: Payer: Self-pay | Admitting: Physician Assistant

## 2020-08-28 VITALS — BP 132/92 | HR 67 | Temp 97.5°F | Resp 14 | Ht 75.0 in | Wt 253.0 lb

## 2020-08-28 DIAGNOSIS — Z0000 Encounter for general adult medical examination without abnormal findings: Secondary | ICD-10-CM

## 2020-08-28 NOTE — Progress Notes (Signed)
Pt presents today to complete physical with Ron Smith,PA-C. CL,RMA 

## 2020-08-28 NOTE — Progress Notes (Signed)
Subjective:    Patient ID: Tony Alvarado, male    DOB: 1966/08/11, 54 y.o.   MRN: [id number]  HPI  54 year old male presents for physical today. He is being followed by neurology for occipital neurologia an headaches for which he has been recommended to use magnesium, and B vitamins. He has also had right knee issues in the past and has had surgery historically, recently noted some changes in ROM and tightening to upper right leg and knee, he is planning to follow up with Ortho regarding this issue.   Continues to manage BP on lisinopril, has been told in the past he may take "1.5" tablets but on days he has tried this he has felt more light headed so he has mostly been taking one tablet daily. He currently does not monitor BP at home.   He exercises regularly. Does not actively restrict salt in diet. Is not actively taking a multi vitamin or Iron supplement   Today's Vitals   08/28/20 0918  BP: (!) 132/92  Pulse: 67  Resp: 14  Temp: (!) 97.5 F (36.4 C)  SpO2: 98%  Weight: 253 lb (114.8 kg)  Height: 6' 3"  (1.905 m)   Body mass index is 31.62 kg/m.  Current Outpatient Medications  Medication Instructions  . baclofen (LIORESAL) 10 MG tablet 1 tablet, Oral, 2 times daily  . fexofenadine (ALLEGRA) 180 mg, Oral, Daily  . fluticasone (FLONASE) 50 MCG/ACT nasal spray 1 spray, Each Nare, Daily  . lisinopril (ZESTRIL) 10 MG tablet TAKE 1 TABLET BY MOUTH EVERY DAY    Past Medical History:  Diagnosis Date  . Chronic headaches   . Occipital neuralgia   . Seasonal allergies    Past Surgical History:  Procedure Laterality Date  . KNEE SURGERY Right    X 2  . NOSE SURGERY       Review of Systems  Constitutional: Negative.   HENT: Negative.   Eyes: Negative.   Respiratory: Negative.   Cardiovascular: Negative.   Gastrointestinal: Negative.   Endocrine: Negative.   Genitourinary: Negative.   Musculoskeletal: Positive for arthralgias.  Skin: Negative.    Allergic/Immunologic: Negative.   Neurological: Positive for headaches.  Hematological: Negative.   Psychiatric/Behavioral: Negative.        Objective:   Physical Exam Constitutional:      General: He is not in acute distress.    Appearance: Normal appearance.  HENT:     Head: Normocephalic and atraumatic.     Right Ear: Tympanic membrane, ear canal and external ear normal.     Left Ear: Tympanic membrane, ear canal and external ear normal.     Nose: Nose normal.     Mouth/Throat:     Mouth: Mucous membranes are moist.     Pharynx: Oropharynx is clear.  Eyes:     Extraocular Movements: Extraocular movements intact.     Pupils: Pupils are equal, round, and reactive to light.  Cardiovascular:     Rate and Rhythm: Normal rate and regular rhythm.     Pulses: Normal pulses.     Heart sounds: Normal heart sounds.  Pulmonary:     Effort: Pulmonary effort is normal.     Breath sounds: Normal breath sounds.  Abdominal:     Palpations: Abdomen is soft.  Musculoskeletal:     Cervical back: Normal range of motion.     Right knee: Decreased range of motion.  Skin:    General: Skin is warm and dry.  Capillary Refill: Capillary refill takes less than 2 seconds.  Neurological:     General: No focal deficit present.     Mental Status: He is alert and oriented to person, place, and time. Mental status is at baseline.  Psychiatric:        Mood and Affect: Mood normal.        Behavior: Behavior normal.        Thought Content: Thought content normal.        Judgment: Judgment normal.    Recent Results (from the past 2160 hour(s))  CMP12+LP+TP+TSH+6AC+PSA+CBC.     Status: Abnormal   Collection Time: 08/17/20  8:50 AM  Result Value Ref Range   Glucose 94 65 - 99 mg/dL   Uric Acid 5.8 3.8 - 8.4 mg/dL    Comment:            Therapeutic target for gout patients: <6.0   BUN 17 6 - 24 mg/dL   Creatinine, Ser 1.03 0.76 - 1.27 mg/dL   GFR calc non Af Amer 82 >59 mL/min/1.73   GFR  calc Af Amer 95 >59 mL/min/1.73    Comment: **In accordance with recommendations from the NKF-ASN Task force,**   [facility] is in the process of updating its eGFR calculation to the   2021 CKD-EPI creatinine equation that estimates kidney function   without a race variable.    BUN/Creatinine Ratio 17 9 - 20   Sodium 138 134 - 144 mmol/L   Potassium 4.9 3.5 - 5.2 mmol/L   Chloride 101 96 - 106 mmol/L   Calcium 9.2 8.7 - 10.2 mg/dL   Phosphorus 2.9 2.8 - 4.1 mg/dL   Total Protein 6.8 6.0 - 8.5 g/dL   Albumin 4.8 3.8 - 4.9 g/dL   Globulin, Total 2.0 1.5 - 4.5 g/dL   Albumin/Globulin Ratio 2.4 (H) 1.2 - 2.2   Bilirubin Total 0.9 0.0 - 1.2 mg/dL   Alkaline Phosphatase 59 44 - 121 IU/L    Comment:               **Please note reference interval change**   LDH 188 121 - 224 IU/L   AST 22 0 - 40 IU/L   ALT 21 0 - 44 IU/L   GGT 15 0 - 65 IU/L   Iron 192 (H) 38 - 169 ug/dL   Cholesterol, Total 174 100 - 199 mg/dL   Triglycerides 96 0 - 149 mg/dL   HDL 51 >39 mg/dL   VLDL Cholesterol Cal 18 5 - 40 mg/dL   LDL Chol Calc (NIH) 105 (H) 0 - 99 mg/dL   Chol/HDL Ratio 3.4 0.0 - 5.0 ratio    Comment:                                   T. Chol/HDL Ratio                                             Men  Women                               1/2 Avg.Risk  3.4    3.3  Avg.Risk  5.0    4.4                                2X Avg.Risk  9.6    7.1                                3X Avg.Risk 23.4   11.0    Estimated CHD Risk 0.5 0.0 - 1.0 times avg.    Comment: The CHD Risk is based on the T. Chol/HDL ratio. Other factors affect CHD Risk such as hypertension, smoking, diabetes, severe obesity, and family history of premature CHD.    TSH 1.770 0.450 - 4.500 uIU/mL   T4, Total 6.0 4.5 - 12.0 ug/dL   T3 Uptake Ratio 27 24 - 39 %   Free Thyroxine Index 1.6 1.2 - 4.9   Prostate Specific Ag, Serum 1.3 0.0 - 4.0 ng/mL    Comment: Roche ECLIA methodology. According to the  American Urological Association, Serum PSA should decrease and remain at undetectable levels after radical prostatectomy. The AUA defines biochemical recurrence as an initial PSA value 0.2 ng/mL or greater followed by a subsequent confirmatory PSA value 0.2 ng/mL or greater. Values obtained with different assay methods or kits cannot be used interchangeably. Results cannot be interpreted as absolute evidence of the presence or absence of malignant disease.    WBC 4.0 3.4 - 10.8 x10E3/uL   RBC 4.63 4.14 - 5.80 x10E6/uL   Hemoglobin 14.5 13.0 - 17.7 g/dL   Hematocrit 41.8 37.5 - 51.0 %   MCV 90 79 - 97 fL   MCH 31.3 26.6 - 33.0 pg   MCHC 34.7 31 - 35 g/dL   RDW 12.5 11.6 - 15.4 %   Platelets 242 150 - 450 x10E3/uL   Neutrophils 57 Not Estab. %   Lymphs 30 Not Estab. %   Monocytes 9 Not Estab. %   Eos 3 Not Estab. %   Basos 1 Not Estab. %   Neutrophils Absolute 2.3 1.40 - 7.00 x10E3/uL   Lymphocytes Absolute 1.2 0 - 3 x10E3/uL   Monocytes Absolute 0.3 0 - 0 x10E3/uL   EOS (ABSOLUTE) 0.1 0.0 - 0.4 x10E3/uL   Basophils Absolute 0.0 0 - 0 x10E3/uL   Immature Granulocytes 0 Not Estab. %   Immature Grans (Abs) 0.0 0.0 - 0.1 x10E3/uL  POCT urinalysis dipstick     Status: None   Collection Time: 08/17/20  9:00 AM  Result Value Ref Range   Color, UA Yellow    Clarity, UA Clear    Glucose, UA Negative Negative   Bilirubin, UA Negative    Ketones, UA Negative    Spec Grav, UA 1.020 1.010 - 1.025   Blood, UA Negative    pH, UA 6.0 5.0 - 8.0   Protein, UA Negative Negative   Urobilinogen, UA 0.2 0.2 or 1.0 E.U./dL   Nitrite, UA Negative    Leukocytes, UA Negative Negative   Appearance     Odor           Assessment & Plan:  Patient will monitor BP at home, given card to keep record and he is motivated to purchase a BP cuff for home monitoring and will f/u if he has recorded BP >140/90 as discussed. Also encouraged to take BP when he experiences HA.   Discussed restricting salt  in diet.  Lisinopril refilled through 9/22  RTC in one year for next physical, earlier with any new concerns.

## 2021-07-18 DIAGNOSIS — M5481 Occipital neuralgia: Secondary | ICD-10-CM | POA: Diagnosis not present

## 2021-07-18 DIAGNOSIS — Z8669 Personal history of other diseases of the nervous system and sense organs: Secondary | ICD-10-CM | POA: Diagnosis not present

## 2021-08-28 DIAGNOSIS — Z7689 Persons encountering health services in other specified circumstances: Secondary | ICD-10-CM | POA: Diagnosis not present

## 2021-08-28 DIAGNOSIS — I10 Essential (primary) hypertension: Secondary | ICD-10-CM | POA: Diagnosis not present

## 2021-08-28 DIAGNOSIS — E782 Mixed hyperlipidemia: Secondary | ICD-10-CM | POA: Diagnosis not present

## 2021-09-02 ENCOUNTER — Other Ambulatory Visit: Payer: Self-pay | Admitting: Internal Medicine

## 2021-09-02 ENCOUNTER — Other Ambulatory Visit: Payer: Self-pay

## 2021-09-02 ENCOUNTER — Ambulatory Visit
Admission: RE | Admit: 2021-09-02 | Discharge: 2021-09-02 | Disposition: A | Discharge status: Home / Self Care | Payer: Self-pay | Source: Ambulatory Visit | Attending: Internal Medicine | Admitting: Internal Medicine

## 2021-09-02 DIAGNOSIS — I10 Essential (primary) hypertension: Secondary | ICD-10-CM

## 2021-09-06 ENCOUNTER — Other Ambulatory Visit: Payer: Self-pay

## 2021-09-06 DIAGNOSIS — Z0000 Encounter for general adult medical examination without abnormal findings: Secondary | ICD-10-CM

## 2021-09-06 LAB — POCT URINALYSIS DIPSTICK
Bilirubin, UA: NEGATIVE
Blood, UA: NEGATIVE
Glucose, UA: NEGATIVE
Ketones, UA: NEGATIVE
Leukocytes, UA: NEGATIVE
Nitrite, UA: NEGATIVE
Protein, UA: NEGATIVE
Spec Grav, UA: 1.015 (ref 1.010–1.025)
Urobilinogen, UA: 0.2 E.U./dL
pH, UA: 6 (ref 5.0–8.0)

## 2021-09-06 NOTE — Progress Notes (Signed)
Annual physical scheduled 09/09/21

## 2021-09-07 LAB — CMP12+LP+TP+TSH+6AC+PSA+CBC…
ALT: 22 IU/L (ref 0–44)
AST: 28 IU/L (ref 0–40)
Albumin/Globulin Ratio: 2.3 — ABNORMAL HIGH (ref 1.2–2.2)
Albumin: 4.8 g/dL (ref 3.8–4.9)
Alkaline Phosphatase: 58 IU/L (ref 44–121)
BUN/Creatinine Ratio: 11 (ref 9–20)
BUN: 14 mg/dL (ref 6–24)
Basophils Absolute: 0 10*3/uL (ref 0.0–0.2)
Basos: 1 %
Bilirubin Total: 0.7 mg/dL (ref 0.0–1.2)
Calcium: 9.5 mg/dL (ref 8.7–10.2)
Chloride: 102 mmol/L (ref 96–106)
Chol/HDL Ratio: 2.8 ratio (ref 0.0–5.0)
Cholesterol, Total: 146 mg/dL (ref 100–199)
Creatinine, Ser: 1.27 mg/dL (ref 0.76–1.27)
EOS (ABSOLUTE): 0.1 10*3/uL (ref 0.0–0.4)
Eos: 2 %
Estimated CHD Risk: <0.5 times avg. (ref 0.0–1.0)
Free Thyroxine Index: 2.1 (ref 1.2–4.9)
GGT: 12 IU/L (ref 0–65)
Globulin, Total: 2.1 g/dL (ref 1.5–4.5)
Glucose: 88 mg/dL (ref 70–99)
HDL: 53 mg/dL (ref >39)
Hematocrit: 40.4 % (ref 37.5–51.0)
Hemoglobin: 14.2 g/dL (ref 13.0–17.7)
Immature Grans (Abs): 0 10*3/uL (ref 0.0–0.1)
Immature Granulocytes: 0 %
Iron: 96 ug/dL (ref 38–169)
LDH: 173 IU/L (ref 121–224)
LDL Chol Calc (NIH): 82 mg/dL (ref 0–99)
Lymphocytes Absolute: 1.2 10*3/uL (ref 0.7–3.1)
Lymphs: 35 %
MCH: 30.7 pg (ref 26.6–33.0)
MCHC: 35.1 g/dL (ref 31.5–35.7)
MCV: 87 fL (ref 79–97)
Monocytes Absolute: 0.3 10*3/uL (ref 0.1–0.9)
Monocytes: 10 %
Neutrophils Absolute: 1.9 10*3/uL (ref 1.4–7.0)
Neutrophils: 52 %
Phosphorus: 3 mg/dL (ref 2.8–4.1)
Platelets: 234 10*3/uL (ref 150–450)
Potassium: 4.6 mmol/L (ref 3.5–5.2)
Prostate Specific Ag, Serum: 1 ng/mL (ref 0.0–4.0)
RBC: 4.63 x10E6/uL (ref 4.14–5.80)
RDW: 11.8 % (ref 11.6–15.4)
Sodium: 139 mmol/L (ref 134–144)
T3 Uptake Ratio: 30 % (ref 24–39)
T4, Total: 6.9 ug/dL (ref 4.5–12.0)
TSH: 2.16 u[IU]/mL (ref 0.450–4.500)
Total Protein: 6.9 g/dL (ref 6.0–8.5)
Triglycerides: 54 mg/dL (ref 0–149)
Uric Acid: 6.3 mg/dL (ref 3.8–8.4)
VLDL Cholesterol Cal: 11 mg/dL (ref 5–40)
WBC: 3.6 10*3/uL (ref 3.4–10.8)
eGFR: 67 mL/min/{1.73_m2} (ref >59)

## 2021-09-09 ENCOUNTER — Other Ambulatory Visit: Payer: Self-pay

## 2021-09-09 ENCOUNTER — Ambulatory Visit: Payer: Self-pay | Admitting: Physician Assistant

## 2021-09-09 ENCOUNTER — Encounter: Payer: Self-pay | Admitting: Physician Assistant

## 2021-09-09 VITALS — BP 124/89 | HR 72 | Temp 97.6°F | Resp 14 | Ht 75.0 in | Wt 250.0 lb

## 2021-09-09 DIAGNOSIS — Z0000 Encounter for general adult medical examination without abnormal findings: Secondary | ICD-10-CM

## 2021-09-09 DIAGNOSIS — Z23 Encounter for immunization: Secondary | ICD-10-CM

## 2021-09-09 DIAGNOSIS — Z0184 Encounter for antibody response examination: Secondary | ICD-10-CM

## 2021-09-09 NOTE — Progress Notes (Signed)
Pt states no issues or concerns at this time. Pt agrees to getting flu shot and tetanus today. Gretel Acre

## 2021-09-09 NOTE — Progress Notes (Signed)
   Subjective:Annual  Physical exam    Patient ID: Tony Alvarado, male    DOB: Jan 19, 1966, 55 y.o.   MRN: [id number]  HPI Patient present for annual physical exam. Voices no concerns or compliant. Recent visit to cardiologist with no acute findings.   Review of Systems Hypertension     Objective:   Physical Exam No acute distress.  Temperature 97.6, pulse 72, respiration 14, BP is 124/89, and patient 96% O2 sat on room air.  Patient weighs 250 pounds and BMI is 31.25. HEENT is unremarkable.  Neck is supple without lymphadenopathy or bruits.  Lungs are clear to auscultation.  Heart regular rate and rhythm.  No acute findings on EKG. Abdomen is negative HSM, normoactive bowel sounds, soft, nontender to palpation. No obvious deformity to the upper or lower extremities.  Patient has full and equal range of motion of the upper and lower extremities. No obvious cervical or lumbar spine deformity.  Patient has full and equal range of motion of the cervical lumbar spine. Cranial nerves Alvarado through XII are grossly intact.  DTR 2+ without clonus.       Assessment & Plan: Well exam.   Discussed lab results with patient.  Advised to follow-up as necessary.

## 2021-09-09 NOTE — Addendum Note (Signed)
Addended by: Christianne Dolin F on: 09/09/2021 01:35 PM   Modules accepted: Orders

## 2021-09-12 ENCOUNTER — Encounter: Payer: Self-pay | Admitting: Physician Assistant

## 2021-09-12 ENCOUNTER — Ambulatory Visit: Payer: Self-pay | Admitting: Physician Assistant

## 2021-09-12 ENCOUNTER — Other Ambulatory Visit: Payer: Self-pay

## 2021-09-12 VITALS — BP 120/82 | HR 78 | Temp 98.2°F | Resp 14 | Ht 75.0 in | Wt 250.0 lb

## 2021-09-12 DIAGNOSIS — Z0289 Encounter for other administrative examinations: Secondary | ICD-10-CM

## 2021-09-12 NOTE — Progress Notes (Signed)
Pt denies any issues or concerns at this time/CL,RMA ?

## 2021-09-12 NOTE — Progress Notes (Signed)
   Subjective: DOT physical exam    Patient ID: Tony Alvarado, male    DOB: [birth year], 55 y.o.   MRN: [id number]  HPI Patient presents for DOT/certification physical exam.  Patient voices no concerns or complaints.   Review of Systems Allergic rhinitis and hypertension.    Objective:   Physical Exam No acute distress.  Temperature 98.2, pulse 78, respiration 14, BP is 120/82, and patient 96% O2 sat on room air.  Patient weighs 250 pounds and BMI is 31.25. HEENT is unremarkable.  Neck is supple without bruits, lymphadenopathy, or thyromegaly.  Lungs are clear to auscultation.  Heart regular rate and rhythm. Abdomen is negative HSM, normoactive bowel sounds, soft, nontender to palpation. No obvious deformity to the upper or lower extremities.  Patient has full and equal range of motion of the upper and lower extremities. No obvious cervical or lumbar spine deformity.  Patient has full and equal range of motion of the cervical and lumbar spine. Cranial nerves Alvarado through XII are grossly intact.  DTR 2+ without clonus.     Assessment & Plan: Well exam.   In accordance to 49 CFR 391.41 (B) (6) patient is qualified for 1 year certification secondary to hypertension which is controlled with medication.

## 2021-10-21 DIAGNOSIS — J019 Acute sinusitis, unspecified: Secondary | ICD-10-CM | POA: Diagnosis not present

## 2021-10-30 DIAGNOSIS — E782 Mixed hyperlipidemia: Secondary | ICD-10-CM | POA: Diagnosis not present

## 2021-10-30 DIAGNOSIS — I10 Essential (primary) hypertension: Secondary | ICD-10-CM | POA: Diagnosis not present

## 2021-12-25 ENCOUNTER — Ambulatory Visit: Payer: Self-pay | Admitting: Physician Assistant

## 2021-12-25 ENCOUNTER — Encounter: Payer: Self-pay | Admitting: Physician Assistant

## 2021-12-25 ENCOUNTER — Other Ambulatory Visit: Payer: Self-pay

## 2021-12-25 VITALS — BP 137/92 | HR 77 | Temp 98.0°F | Resp 14 | Ht 75.0 in | Wt 250.0 lb

## 2021-12-25 DIAGNOSIS — J301 Allergic rhinitis due to pollen: Secondary | ICD-10-CM

## 2021-12-25 MED ORDER — OLOPATADINE HCL 0.1 % OP SOLN: 1.0000 [drp] | Freq: Two times a day (BID) | OPHTHALMIC | 2 refills | Status: DC

## 2021-12-25 MED ORDER — METHYLPREDNISOLONE 4 MG PO TBPK: ORAL_TABLET | ORAL | 0 refills | Status: DC

## 2021-12-25 MED ORDER — TRIAMCINOLONE ACETONIDE 40 MG/ML IJ SUSP
40.0000 mg | Freq: Once | INTRAMUSCULAR | Status: AC
  Administered 2021-12-25: 40 mg via INTRAMUSCULAR

## 2021-12-25 MED ORDER — FEXOFENADINE HCL 180 MG PO TABS: 180.0000 mg | ORAL_TABLET | Freq: Every day | ORAL | 0 refills | Status: DC

## 2021-12-25 NOTE — Progress Notes (Signed)
Itchy face, eyes and skin. Allergies have gotten worse and his medications are not giving him any relief. Nasal spray, Claritin and eye drops with a benadryl at night. Tony Alvarado

## 2021-12-25 NOTE — Addendum Note (Signed)
Addended by: Christianne Dolin F on: 12/25/2021 02:22 PM   Modules accepted: Orders

## 2021-12-25 NOTE — Progress Notes (Signed)
° °  Subjective: Seasonal allergies    Patient ID: Tony Alvarado, male    DOB: [birth year], 56 y.o.   MRN: [id number]  HPI Patient presents with watery eyes, itchy skin, and rhinorrhea.  Patient complaint is consistent with his long history of seasonal allergies.  Patient discontinue allergy shots greater than 10 years ago.  Patient states this lateral warm weather has prolong his allergies and winter season.  Currently using over-the-counter loratadine and Benadryl.  Patient continue to use Flonase.   Review of Systems Hypertension and seasonal rhinitis.    Objective:   Physical Exam  No acute distress.  Temperature is 98, pulse 77, respiration 14, BP 137/92, patient 97% O2 sat on room air.  Patient weighs 250 pounds and BMI is 31.25. HEENT is remarkable for bilateral erythematous conjunctiva.  Clear watery discharge from the eyes.  Copious rhinorrhea and postnasal drainage.  Boggy nasal turbinates. Neck is supple without lymphadenopathy or bruits.  Lungs are clear to auscultation.  Heart regular rate and rhythm.      Assessment & Plan:   Advised to discontinue loratadine.  Patient given Kenalog IM followed by Medrol Dosepak.  Patient given prescription for Patanol eyedrops and Allegra.  Patient advised to follow-up as necessary.

## 2022-01-29 DIAGNOSIS — D2261 Melanocytic nevi of right upper limb, including shoulder: Secondary | ICD-10-CM | POA: Diagnosis not present

## 2022-01-29 DIAGNOSIS — D2271 Melanocytic nevi of right lower limb, including hip: Secondary | ICD-10-CM | POA: Diagnosis not present

## 2022-01-29 DIAGNOSIS — D2262 Melanocytic nevi of left upper limb, including shoulder: Secondary | ICD-10-CM | POA: Diagnosis not present

## 2022-01-29 DIAGNOSIS — X32XXXA Exposure to sunlight, initial encounter: Secondary | ICD-10-CM | POA: Diagnosis not present

## 2022-01-29 DIAGNOSIS — L570 Actinic keratosis: Secondary | ICD-10-CM | POA: Diagnosis not present

## 2022-01-29 DIAGNOSIS — B360 Pityriasis versicolor: Secondary | ICD-10-CM | POA: Diagnosis not present

## 2022-01-29 DIAGNOSIS — D225 Melanocytic nevi of trunk: Secondary | ICD-10-CM | POA: Diagnosis not present

## 2022-01-29 DIAGNOSIS — D2272 Melanocytic nevi of left lower limb, including hip: Secondary | ICD-10-CM | POA: Diagnosis not present

## 2022-01-29 DIAGNOSIS — L821 Other seborrheic keratosis: Secondary | ICD-10-CM | POA: Diagnosis not present

## 2022-02-10 ENCOUNTER — Ambulatory Visit: Payer: 59 | Admitting: Dermatology

## 2022-05-30 DIAGNOSIS — H33312 Horseshoe tear of retina without detachment, left eye: Secondary | ICD-10-CM | POA: Diagnosis not present

## 2022-05-30 DIAGNOSIS — H43812 Vitreous degeneration, left eye: Secondary | ICD-10-CM | POA: Diagnosis not present

## 2022-05-30 DIAGNOSIS — H43821 Vitreomacular adhesion, right eye: Secondary | ICD-10-CM | POA: Diagnosis not present

## 2022-05-30 DIAGNOSIS — H5203 Hypermetropia, bilateral: Secondary | ICD-10-CM | POA: Diagnosis not present

## 2022-07-03 ENCOUNTER — Telehealth: Payer: Self-pay

## 2022-07-03 NOTE — Chronic Care Management (AMB) (Signed)
  Care Coordination  Note  07/03/2022 Name: Tony Alvarado MRN: [id number] DOB: 04-12-1966  [name] is a 56 y.o. year old male who is a primary care patient of Pcp, No. I reached out to [name] by phone today to offer care coordination services.      Mr. Penna was given information about Care Coordination services today including:  The Care Coordination services include support from the care team which includes your Nurse Coordinator, Clinical Social Worker, or Pharmacist.  The Care Coordination team is here to help remove barriers to the health concerns and goals most important to you. Care Coordination services are voluntary and the patient may decline or stop services at any time by request to their care team member.   Patient did not agree to participate in care coordination services at this time.  Follow up plan: Patient declines further follow up or participation in care coordination services.   [name], RMA Care Guide [facility] [place], [place] [address] Direct Dial: [phone] [email or web address]

## 2022-08-27 ENCOUNTER — Encounter: Payer: Self-pay | Admitting: Physician Assistant

## 2022-08-27 ENCOUNTER — Ambulatory Visit: Payer: Self-pay | Admitting: Physician Assistant

## 2022-08-27 VITALS — BP 134/93 | HR 65 | Temp 97.6°F | Resp 14 | Ht 75.0 in | Wt 255.0 lb

## 2022-08-27 DIAGNOSIS — J0100 Acute maxillary sinusitis, unspecified: Secondary | ICD-10-CM

## 2022-08-27 MED ORDER — FEXOFENADINE-PSEUDOEPHED ER 60-120 MG PO TB12: 1.0000 | ORAL_TABLET | Freq: Two times a day (BID) | ORAL | 0 refills | Status: DC

## 2022-08-27 MED ORDER — BENZONATATE 200 MG PO CAPS: 200.0000 mg | ORAL_CAPSULE | Freq: Two times a day (BID) | ORAL | 0 refills | Status: DC | PRN

## 2022-08-27 MED ORDER — CLARITHROMYCIN 500 MG PO TABS: 500.0000 mg | ORAL_TABLET | Freq: Two times a day (BID) | ORAL | 0 refills | Status: DC

## 2022-08-27 NOTE — Progress Notes (Signed)
Recent trip to [place].  Flight back to [facility] 08/18/2022. S/Sx started 08/19/2022: Stuffy nose Sinus drainage Nasal drainage - dark yellow Headache Sinus pain/pressure around eyes  Has been taking Sudafel without relief  _________________________________ Home Covid Test Results = negative Burna Sis

## 2022-08-27 NOTE — Progress Notes (Signed)
   Subjective: Sinus congestion    Patient ID: Tony Alvarado, male    DOB: 1965/11/23, 56 y.o.   MRN: [id number]  HPI Patient complain of bilateral nasal congestion facial pain for 1/2 weeks.  Onset of complaint was returning back from [place].  Patient states frontal headache associated complaint.  States postnasal drainage which increases the cough when he is laying down at night.  Denies fever chills associated complaint.   Review of Systems Allergic rhinitis and hypertension    Objective:   Physical Exam  BP is 134/93, pulse 65, respiration 14, temperature 97.6, patient 90% O2 sat on room air.  Patient weighs 255 pounds and BMI is 31.87. HEENT is remarkable for edematous bilateral nasal turbinates.  Positive maxillary and frontal sinus guarding with palpation. Postnasal drainage.  Pharynx is not erythematous. Neck is supple for lymphadenopathy or bruits. Lungs are clear to auscultation.  Heart regular rate and rhythm.      Assessment & Plan: Subacute maxillary sinusitis  Patient given discharge care instructions.  Patient given a prescription for Biaxin, Allegra-D, Tessalon Perles.  Patient advised to follow-up 5 days if no improvement or worsening complaint.

## 2022-09-15 ENCOUNTER — Encounter: Payer: 59 | Admitting: Physician Assistant

## 2022-09-15 ENCOUNTER — Ambulatory Visit: Payer: Self-pay

## 2022-09-15 DIAGNOSIS — Z0000 Encounter for general adult medical examination without abnormal findings: Secondary | ICD-10-CM

## 2022-09-15 LAB — POCT URINALYSIS DIPSTICK
Bilirubin, UA: NEGATIVE
Blood, UA: NEGATIVE
Glucose, UA: NEGATIVE
Ketones, UA: NEGATIVE
Leukocytes, UA: NEGATIVE
Nitrite, UA: NEGATIVE
Protein, UA: NEGATIVE
Spec Grav, UA: 1.015 (ref 1.010–1.025)
Urobilinogen, UA: 0.2 E.U./dL
pH, UA: 6 (ref 5.0–8.0)

## 2022-09-16 LAB — CMP12+LP+TP+TSH+6AC+PSA+CBC…
ALT: 20 IU/L (ref 0–44)
AST: 19 IU/L (ref 0–40)
Albumin/Globulin Ratio: 2.1 (ref 1.2–2.2)
Albumin: 4.4 g/dL (ref 3.8–4.9)
Alkaline Phosphatase: 64 IU/L (ref 44–121)
BUN/Creatinine Ratio: 13 (ref 9–20)
BUN: 14 mg/dL (ref 6–24)
Basophils Absolute: 0.1 10*3/uL (ref 0.0–0.2)
Basos: 1 %
Bilirubin Total: 0.5 mg/dL (ref 0.0–1.2)
Calcium: 9.6 mg/dL (ref 8.7–10.2)
Chloride: 103 mmol/L (ref 96–106)
Chol/HDL Ratio: 3.5 ratio (ref 0.0–5.0)
Cholesterol, Total: 175 mg/dL (ref 100–199)
Creatinine, Ser: 1.09 mg/dL (ref 0.76–1.27)
EOS (ABSOLUTE): 0.1 10*3/uL (ref 0.0–0.4)
Eos: 2 %
Estimated CHD Risk: 0.6 times avg. (ref 0.0–1.0)
Free Thyroxine Index: 1.9 (ref 1.2–4.9)
GGT: 11 IU/L (ref 0–65)
Globulin, Total: 2.1 g/dL (ref 1.5–4.5)
Glucose: 92 mg/dL (ref 70–99)
HDL: 50 mg/dL (ref >39)
Hematocrit: 41.6 % (ref 37.5–51.0)
Hemoglobin: 14.4 g/dL (ref 13.0–17.7)
Immature Grans (Abs): 0 10*3/uL (ref 0.0–0.1)
Immature Granulocytes: 0 %
Iron: 143 ug/dL (ref 38–169)
LDH: 169 IU/L (ref 121–224)
LDL Chol Calc (NIH): 108 mg/dL — ABNORMAL HIGH (ref 0–99)
Lymphocytes Absolute: 1.3 10*3/uL (ref 0.7–3.1)
Lymphs: 29 %
MCH: 30.7 pg (ref 26.6–33.0)
MCHC: 34.6 g/dL (ref 31.5–35.7)
MCV: 89 fL (ref 79–97)
Monocytes Absolute: 0.4 10*3/uL (ref 0.1–0.9)
Monocytes: 10 %
Neutrophils Absolute: 2.6 10*3/uL (ref 1.4–7.0)
Neutrophils: 58 %
Phosphorus: 3.3 mg/dL (ref 2.8–4.1)
Platelets: 249 10*3/uL (ref 150–450)
Potassium: 4.5 mmol/L (ref 3.5–5.2)
Prostate Specific Ag, Serum: 1.1 ng/mL (ref 0.0–4.0)
RBC: 4.69 x10E6/uL (ref 4.14–5.80)
RDW: 12.4 % (ref 11.6–15.4)
Sodium: 140 mmol/L (ref 134–144)
T3 Uptake Ratio: 30 % (ref 24–39)
T4, Total: 6.3 ug/dL (ref 4.5–12.0)
TSH: 2.24 u[IU]/mL (ref 0.450–4.500)
Total Protein: 6.5 g/dL (ref 6.0–8.5)
Triglycerides: 90 mg/dL (ref 0–149)
Uric Acid: 5.2 mg/dL (ref 3.8–8.4)
VLDL Cholesterol Cal: 17 mg/dL (ref 5–40)
WBC: 4.5 10*3/uL (ref 3.4–10.8)
eGFR: 80 mL/min/{1.73_m2} (ref >59)

## 2022-09-22 ENCOUNTER — Encounter: Payer: Self-pay | Admitting: Physician Assistant

## 2022-09-22 ENCOUNTER — Ambulatory Visit: Payer: Self-pay | Admitting: Physician Assistant

## 2022-09-22 VITALS — BP 130/96 | HR 65 | Temp 97.8°F | Resp 14 | Ht 74.0 in | Wt 250.0 lb

## 2022-09-22 DIAGNOSIS — Z0000 Encounter for general adult medical examination without abnormal findings: Secondary | ICD-10-CM

## 2022-09-22 NOTE — Progress Notes (Signed)
[facility]    ____________________________________________   None    (approximate)  I have reviewed the triage vital signs and the nursing notes.   HISTORY  Chief Complaint No chief complaint on file.  HPI Tony Alvarado is a 56 y.o. male patient presents for annual physical exam.  Patient reports no concerns or complaints.         Past Medical History:  Diagnosis Date   Chronic headaches    Occipital neuralgia    Seasonal allergies     Patient Active Problem List   Diagnosis Date Noted   Seasonal allergic rhinitis due to pollen 01/06/2020   BMI 32.0-32.9,adult 01/06/2020   Chronic tension-type headache, intractable 01/06/2020   Low vitamin D level 01/06/2020   Low serum vitamin B12 01/06/2020   Borderline high blood pressure 07/25/2019   Benign essential HTN 07/25/2019   Class 1 obesity due to excess calories with serious comorbidity and body mass index (BMI) of 32.0 to 32.9 in adult 07/25/2019   Hyperlipidemia, mixed 07/25/2019   IFG (impaired fasting glucose) 07/25/2019   Intractable migraine without aura and without status migrainosus 03/31/2018   Occipital neuralgia of left side 03/31/2018    Past Surgical History:  Procedure Laterality Date   KNEE SURGERY Right    X 2   NOSE SURGERY      Prior to Admission medications   Medication Sig Start Date End Date Taking? Authorizing Provider  baclofen (LIORESAL) 10 MG tablet Take 1 tablet by mouth 2 (two) times a day. 03/24/19  Yes [provider]  fexofenadine (ALLEGRA) 180 MG tablet Take 1 tablet (180 mg total) by mouth daily. 12/25/21  Yes Joni Reining, PA-C  fluticasone [facility]) 50 MCG/ACT nasal spray Place 1 spray into both nostrils daily. 01/04/20 09/22/22 Yes Betancourt, Jarold Song, NP  lisinopril (ZESTRIL) 10 MG tablet Take 1 tablet by mouth daily. 08/28/21 09/22/22 Yes [provider]    Allergies Penicillins and Pollen extract  Family History   Problem Relation Age of Onset   Cancer Maternal Grandmother    Heart disease Paternal Grandfather     Social History Social History   Tobacco Use   Smoking status: Never   Smokeless tobacco: Never  Substance Use Topics   Alcohol use: Never    Review of Systems Constitutional: No fever/chills Eyes: No visual changes. ENT: No sore throat. Cardiovascular: Denies chest pain. Respiratory: Denies shortness of breath. Gastrointestinal: No abdominal pain.  No nausea, no vomiting.  No diarrhea.  No constipation. Genitourinary: Negative for dysuria. Musculoskeletal: Negative for back pain. Skin: Negative for rash. Neurological: Negative for headaches, focal weakness or numbness. Allergic/Immunilogical: Penicillin and pollen ____________________________________________   PHYSICAL EXAM:  VITAL SIGNS: BP is 130/96, pulse 65, respiration 14, temperature 97.8, patient 90% O2 sat on room air.  Patient weighs 250 pounds and BMI is 32.10. Constitutional: Alert and oriented. Well appearing and in no acute distress. Eyes: Conjunctivae are normal. PERRL. EOMI. Head: Atraumatic. Nose: No congestion/rhinnorhea. Mouth/Throat: Mucous membranes are moist.  Oropharynx non-erythematous. Neck: No stridor.  No cervical spine tenderness to palpation. Hematological/Lymphatic/Immunilogical: No cervical lymphadenopathy. Cardiovascular: Normal rate, regular rhythm. Grossly normal heart sounds.  Good peripheral circulation. Respiratory: Normal respiratory effort.  No retractions. Lungs CTAB. Gastrointestinal: Soft and nontender. No distention. No abdominal bruits. No CVA tenderness. Genitourinary: Deferred Musculoskeletal: No lower extremity tenderness nor edema.  No joint effusions. Neurologic:  Normal speech and language. No gross focal neurologic deficits are appreciated. No  gait instability. Skin:  Skin is warm, dry and intact. No rash noted. Psychiatric: Mood and affect are normal. Speech and  behavior are normal.  ____________________________________________   LABS ____________________________________________  EKG  Sinus rhythm 60 bpm.  Nonspecific QRS widening.  No change from last EKG. ____________________________________________    ____________________________________________   INITIAL IMPRESSION / ASSESSMENT AND PLAN  As part of my medical decision making, I reviewed the following data within the electronic MEDICAL RECORD NUMBER      Discussed ECG and lab results with patient.       ____________________________________________   FINAL CLINICAL IMPRESSION Well exam   ED Discharge Orders     None        Note:  This document was prepared using Dragon voice recognition software and may include unintentional dictation errors.

## 2022-09-22 NOTE — Progress Notes (Signed)
Pt presents today to complete physical no concerns or issues at this time/CL,RMA

## 2022-09-30 ENCOUNTER — Ambulatory Visit: Payer: Self-pay | Admitting: Physician Assistant

## 2022-09-30 ENCOUNTER — Encounter: Payer: Self-pay | Admitting: Physician Assistant

## 2022-09-30 VITALS — BP 135/90 | HR 70 | Temp 97.6°F | Resp 14 | Ht 75.0 in | Wt 250.0 lb

## 2022-09-30 DIAGNOSIS — Z024 Encounter for examination for driving license: Secondary | ICD-10-CM

## 2022-09-30 LAB — POCT URINALYSIS DIPSTICK
Bilirubin, UA: NEGATIVE
Blood, UA: NEGATIVE
Glucose, UA: NEGATIVE
Ketones, UA: NEGATIVE
Leukocytes, UA: NEGATIVE
Nitrite, UA: NEGATIVE
Protein, UA: NEGATIVE
Spec Grav, UA: 1.025 (ref 1.010–1.025)
Urobilinogen, UA: 0.2 E.U./dL
pH, UA: 6 (ref 5.0–8.0)

## 2022-09-30 MED ORDER — PSEUDOEPH-BROMPHEN-DM 30-2-10 MG/5ML PO SYRP: 5.0000 mL | ORAL_SOLUTION | Freq: Four times a day (QID) | ORAL | 0 refills | Status: DC | PRN

## 2022-09-30 NOTE — Progress Notes (Signed)
[facility]    None    (approximate)  I have reviewed the triage vital signs and the nursing notes.   HISTORY  Chief Complaint Commercial Driver's License Exam   HPI ESDRAS DELAIR II is a 56 y.o. male patient presents for DOT recertification.  Patient reports no concerns or complaints.         Past Medical History:  Diagnosis Date   Chronic headaches    Occipital neuralgia    Seasonal allergies     Patient Active Problem List   Diagnosis Date Noted   Seasonal allergic rhinitis due to pollen 01/06/2020   BMI 32.0-32.9,adult 01/06/2020   Chronic tension-type headache, intractable 01/06/2020   Low vitamin D level 01/06/2020   Low serum vitamin B12 01/06/2020   Borderline high blood pressure 07/25/2019   Benign essential HTN 07/25/2019   Class 1 obesity due to excess calories with serious comorbidity and body mass index (BMI) of 32.0 to 32.9 in adult 07/25/2019   Hyperlipidemia, mixed 07/25/2019   IFG (impaired fasting glucose) 07/25/2019   Intractable migraine without aura and without status migrainosus 03/31/2018   Occipital neuralgia of left side 03/31/2018    Past Surgical History:  Procedure Laterality Date   KNEE SURGERY Right    X 2   NOSE SURGERY      Prior to Admission medications   Medication Sig Start Date End Date Taking? Authorizing Provider  brompheniramine-pseudoephedrine-DM 30-2-10 MG/5ML syrup Take 5 mLs by mouth 4 (four) times daily as needed. 09/30/22  Yes Joni Reining, PA-C  baclofen (LIORESAL) 10 MG tablet Take 1 tablet by mouth 2 (two) times a day. 03/24/19   [provider]  fexofenadine (ALLEGRA) 180 MG tablet Take 1 tablet (180 mg total) by mouth daily. 12/25/21   Joni Reining, PA-C  fluticasone (FLONASE) 50 MCG/ACT nasal spray Place 1 spray into both nostrils daily. 01/04/20 09/22/22  Betancourt, Jarold Song, NP  lisinopril (ZESTRIL) 10 MG tablet Take 1 tablet by mouth daily. 08/28/21 09/22/22   [provider]    Allergies Penicillins and Pollen extract  Family History  Problem Relation Age of Onset   Cancer Maternal Grandmother    Heart disease Paternal Grandfather     Social History Social History   Tobacco Use   Smoking status: Never   Smokeless tobacco: Never  Substance Use Topics   Alcohol use: Never    Review of Systems Constitutional: No fever/chills Eyes: No visual changes. ENT: No sore throat. Cardiovascular: Denies chest pain. Respiratory: Denies shortness of breath. Gastrointestinal: No abdominal pain.  No nausea, no vomiting.  No diarrhea.  No constipation. Genitourinary: Negative for dysuria. Musculoskeletal: Negative for back pain. Skin: Negative for rash. Neurological: Negative for headaches, focal weakness or numbness. Endocrine: Hyperlipidemia and hypertension Allergic/Immunilogical: Penicillin and pollen extract ____________________________________________   PHYSICAL EXAM:  VITAL SIGNS: BP is 135/90, Constitutional: Alert and oriented. Well appearing and in no acute distress. Eyes: Conjunctivae are normal. PERRL. EOMI. Head: Atraumatic. Nose: No congestion/rhinnorhea. Mouth/Throat: Mucous membranes are moist.  Oropharynx non-erythematous. Neck: No stridor.  No cervical spine tenderness to palpation. Hematological/Lymphatic/Immunilogical: No cervical lymphadenopathy. Cardiovascular: Normal rate, regular rhythm. Grossly normal heart sounds.  Good peripheral circulation. Respiratory: Normal respiratory effort.  No retractions. Lungs CTAB. Gastrointestinal: Soft and nontender. No distention. No abdominal bruits. No CVA tenderness. Genitourinary: Deferred Musculoskeletal: No lower extremity tenderness nor edema.  No joint effusions. Neurologic:  Normal speech and language. No gross focal neurologic deficits  are appreciated. No gait instability. Skin:  Skin is warm, dry and intact. No rash noted. Psychiatric: Mood and affect are  normal. Speech and behavior are normal.  ____________________________________________   LABS          Component Ref Range & Units 2 wk ago (09/15/22) 1 yr ago (09/06/21) 2 yr ago (08/17/20) 2 yr ago (12/29/19)  Color, UA  Light Yellow yellow Yellow yellow  Clarity, UA  Clear clear Clear clear  Glucose, UA Negative Negative Negative Negative Negative  Bilirubin, UA  Negative negative Negative negative  Ketones, UA  Negative negative Negative negative  Spec Grav, UA 1.010 - 1.025 1.015 1.015 1.020 1.020  Blood, UA  Negative negative Negative negative  pH, UA 5.0 - 8.0 6.0 6.0 6.0 7.0  Protein, UA Negative Negative Negative Negative Negative  Urobilinogen, UA 0.2 or 1.0 E.U./dL 0.2 0.2 0.2 0.2  Nitrite, UA  Negative negative Negative negative  Leukocytes, UA Negative Negative Negative Negative Negative  Appearance   light              ____________________________________________    ____________________________________________   INITIAL IMPRESSION / ASSESSMENT AND PLAN  As part of my medical decision making, I reviewed the following data within the electronic MEDICAL RECORD NUMBER      Discussed exam results with patient.  Advised to continue previous medication.  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     ____________________________________________   FINAL CLINICAL IMPRESSION  Well exam   ED Discharge Orders          Ordered    POCT urinalysis dipstick        09/30/22 0906    brompheniramine-pseudoephedrine-DM 30-2-10 MG/5ML syrup  4 times daily PRN        09/30/22 0950             Note:  This document was prepared using Dragon voice recognition software and may include unintentional dictation errors.

## 2022-09-30 NOTE — Progress Notes (Signed)
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 Pt concerned of drainage causing cough feeling like a tickle x 1 week.

## 2022-10-14 ENCOUNTER — Encounter: Payer: Self-pay | Admitting: Physician Assistant

## 2022-10-14 ENCOUNTER — Ambulatory Visit: Payer: Self-pay | Admitting: Physician Assistant

## 2022-10-14 DIAGNOSIS — Z1152 Encounter for screening for COVID-19: Secondary | ICD-10-CM

## 2022-10-14 LAB — POC COVID19 BINAXNOW: SARS Coronavirus 2 Ag: NEGATIVE

## 2022-10-14 LAB — POCT INFLUENZA A/B
Influenza A, POC: POSITIVE — AB
Influenza B, POC: NEGATIVE

## 2022-10-14 MED ORDER — IBUPROFEN 800 MG PO TABS: 800.0000 mg | ORAL_TABLET | Freq: Three times a day (TID) | ORAL | 0 refills | Status: DC | PRN

## 2022-10-14 MED ORDER — HYDROCOD POLI-CHLORPHE POLI ER 10-8 MG/5ML PO SUER: 5.0000 mL | Freq: Every evening | ORAL | 0 refills | Status: DC | PRN

## 2022-10-14 MED ORDER — OSELTAMIVIR PHOSPHATE 75 MG PO CAPS: 75.0000 mg | ORAL_CAPSULE | Freq: Two times a day (BID) | ORAL | 0 refills | Status: DC

## 2022-10-14 MED ORDER — PSEUDOEPHEDRINE HCL ER 120 MG PO TB12: 120.0000 mg | ORAL_TABLET | Freq: Two times a day (BID) | ORAL | 0 refills | Status: DC

## 2022-10-14 NOTE — Progress Notes (Signed)
  Subjective: Cough and congestion     Patient ID: Tony Alvarado, male   DOB: May 13, 1966, 56 y.o.   MRN: [id number]  HPI Patient presents with 4 days of cough and congestion.  Patient tested negative COVID-19 but positive for influenza A.  Review of Systems Negative septa chief complaint    Objective:   Physical Exam Deferred secondary to this being a telephonic    Assessment: Influenza         Plan:     Patient given a prescription for Tamiflu, Tessalon Perles, Tussionex, and ibuprofen.  Advised to follow-up with clinical improvement or worsening complaint.

## 2022-10-14 NOTE — Progress Notes (Signed)
Pt presents today with cough,congestion, fatigue, weak, no engery and chills since 10/11/22.  Covid and flu results: FLU A

## 2022-10-15 ENCOUNTER — Other Ambulatory Visit: Payer: Self-pay | Admitting: Physician Assistant

## 2022-10-15 MED ORDER — ONDANSETRON HCL 8 MG PO TABS: 8.0000 mg | ORAL_TABLET | Freq: Three times a day (TID) | ORAL | 0 refills | Status: DC | PRN

## 2022-11-03 DIAGNOSIS — J018 Other acute sinusitis: Secondary | ICD-10-CM | POA: Diagnosis not present

## 2023-01-20 ENCOUNTER — Encounter: Payer: Self-pay | Admitting: Physician Assistant

## 2023-01-20 ENCOUNTER — Ambulatory Visit: Payer: Self-pay | Admitting: Physician Assistant

## 2023-01-20 VITALS — BP 142/97 | HR 76 | Temp 98.4°F | Resp 14

## 2023-01-20 DIAGNOSIS — J301 Allergic rhinitis due to pollen: Secondary | ICD-10-CM

## 2023-01-20 MED ORDER — FEXOFENADINE-PSEUDOEPHED ER 60-120 MG PO TB12: 1.0000 | ORAL_TABLET | Freq: Two times a day (BID) | ORAL | 0 refills | Status: DC

## 2023-01-20 MED ORDER — TRIAMCINOLONE ACETONIDE 40 MG/ML IJ SUSP
40.0000 mg | Freq: Once | INTRAMUSCULAR | Status: AC
  Administered 2023-01-20: 40 mg via INTRAMUSCULAR

## 2023-01-20 MED ORDER — METHYLPREDNISOLONE 4 MG PO TBPK: ORAL_TABLET | ORAL | 0 refills | Status: DC

## 2023-01-20 NOTE — Progress Notes (Signed)
   Subjective: Seasonal rhinitis    Patient ID: Tony Alvarado, male    DOB: [birth year], 57 y.o.   MRN: [id number]  HPI Patient presents with chief complaint of seasonal allergies. Reports having itchy/watery eyes, itchy nose & throat, and runny nose with some mild throat irritation. States this occurs every year around the same time of year. Requesting steroid shot.    Review of Systems Itchy, watery eyes, nasal congestion, throat irritation   Objective:   Physical Exam Exam deffered  VS: BP 142/97, P 76, T 98.67F, SpO2 99%, Wt 250lb       Assessment & Plan:  Plan is to administer IM kenalog in clinic. Order placed for x5 day [place].

## 2023-01-20 NOTE — Progress Notes (Signed)
Requesting a sterroid injection for seasonal allergies.  States routine medications are not helping symptoms.  AMD

## 2023-01-26 ENCOUNTER — Other Ambulatory Visit: Payer: Self-pay

## 2023-01-26 DIAGNOSIS — I10 Essential (primary) hypertension: Secondary | ICD-10-CM

## 2023-01-26 MED ORDER — LISINOPRIL 10 MG PO TABS: 10.0000 mg | ORAL_TABLET | Freq: Every day | ORAL | 3 refills | Status: DC

## 2023-02-11 DIAGNOSIS — L570 Actinic keratosis: Secondary | ICD-10-CM | POA: Diagnosis not present

## 2023-02-11 DIAGNOSIS — X32XXXA Exposure to sunlight, initial encounter: Secondary | ICD-10-CM | POA: Diagnosis not present

## 2023-02-11 DIAGNOSIS — D2362 Other benign neoplasm of skin of left upper limb, including shoulder: Secondary | ICD-10-CM | POA: Diagnosis not present

## 2023-02-11 DIAGNOSIS — L814 Other melanin hyperpigmentation: Secondary | ICD-10-CM | POA: Diagnosis not present

## 2023-07-15 IMAGING — CT CT CARDIAC CORONARY ARTERY CALCIUM SCORE
3 series · 14 of 20 positions shown, 16 images · non-contrast
Comparison: None.
COMPARISON: None.

Addendum:
EXAM:
OVER-READ INTERPRETATION  CT CHEST

The following report is an over-read performed by radiologist Dr.
Trevon Reel [REDACTED] on 09/02/2021. This
over-read does not include interpretation of cardiac or coronary
anatomy or pathology. The coronary calcium score interpretation by
the cardiologist is attached.
CLINICAL DATA: Risk stratification
Coronary Calcium Score
TECHNIQUE: The patient was scanned on a Siemens Somatom go.Top Scanner. Axial
non-contrast 3 mm slices were carried out through the heart. The
data set was analyzed on a dedicated work station and scored using
the Agatson method.

[Series 2: sa36 calcium scoring 3.00 · axial · 0.42mm/px · z∈[-1163,-1082]mm · 4 of 46 slices shown]
[im 10/46  vessel]
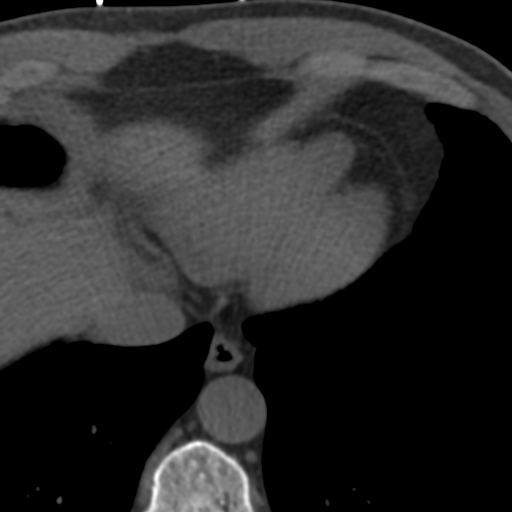
[im 19/46  vessel]
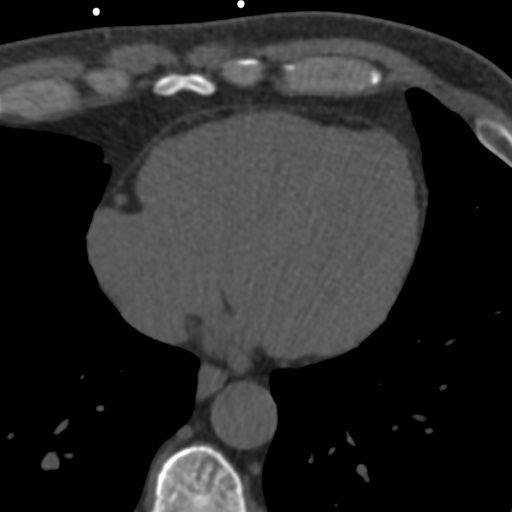
[im 28/46  vessel]
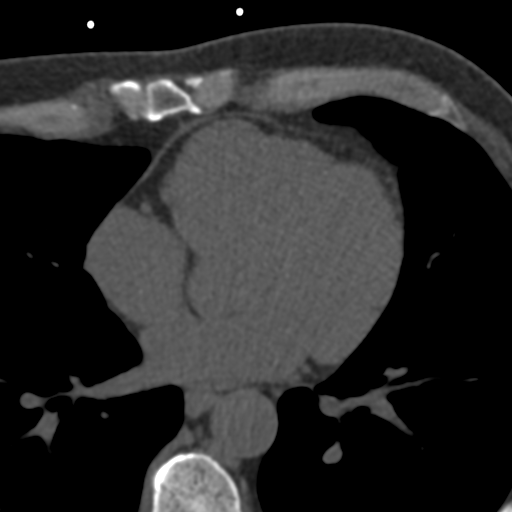
[im 37/46  vessel]
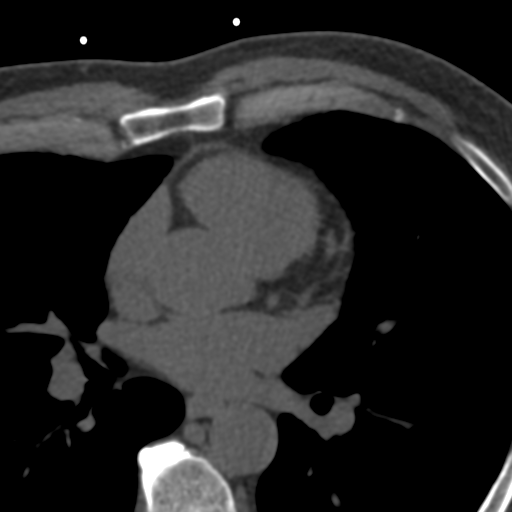

[Series 5: full fov st calcium scoring 3.00 · axial · 0.74mm/px · z∈[-1169,-1079]mm · 5 of 46 slices shown, 7 images]
[im 8/46  vessel]
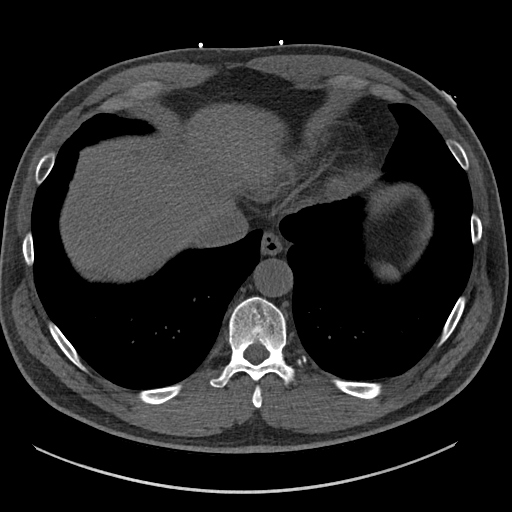
[im 8/46  lung]
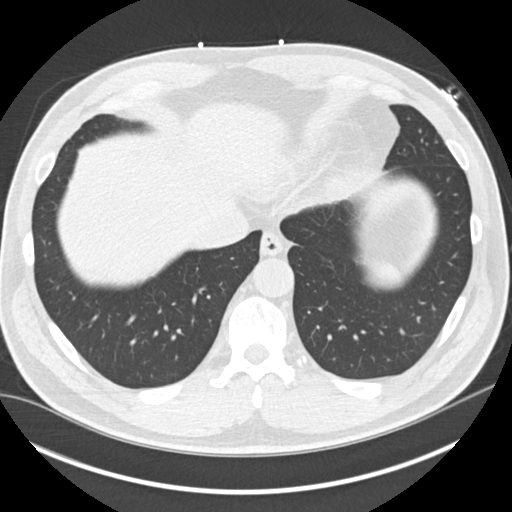
[im 16/46  vessel]
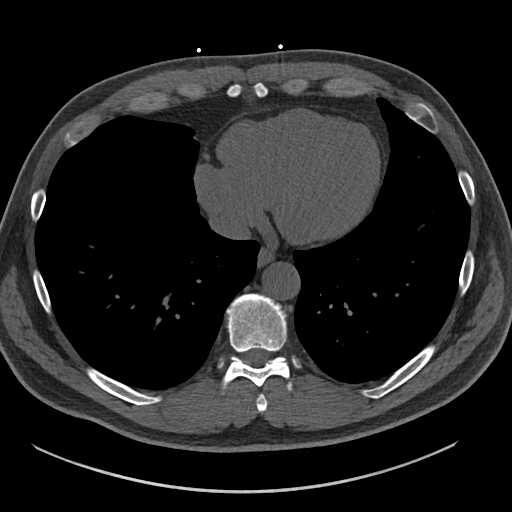
[im 23/46  vessel]
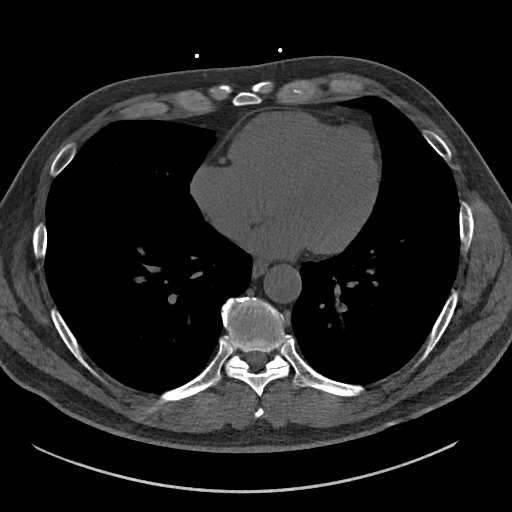
[im 31/46  vessel]
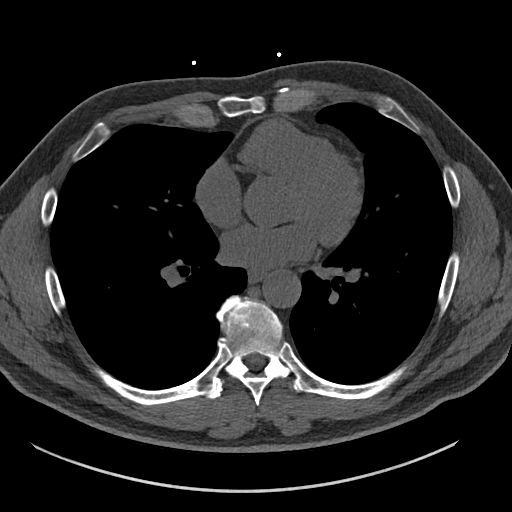
[im 38/46  vessel]
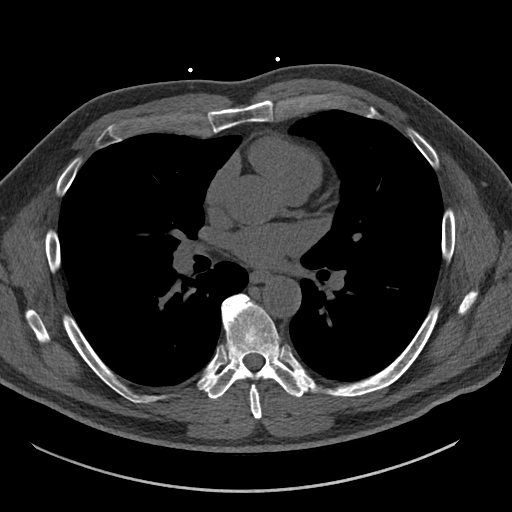
[im 38/46  lung]
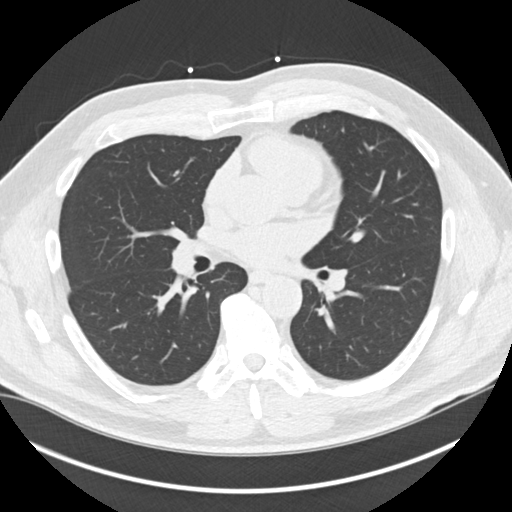

[Series 10: full fov lungs calcium scoring 3.00 ax · axial · 0.74mm/px · z∈[-1169,-1079]mm · 5 of 46 slices shown]
[im 8/46  vessel]
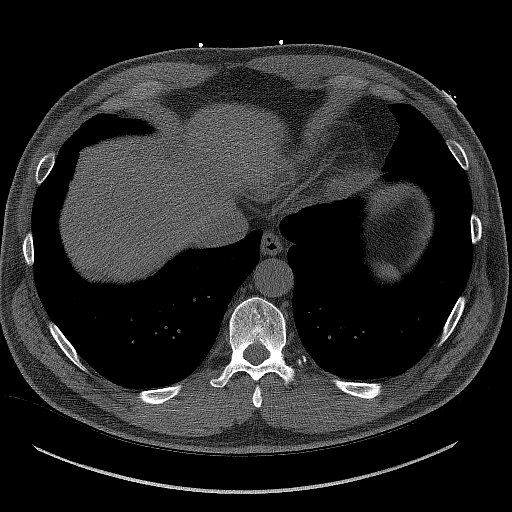
[im 16/46  vessel]
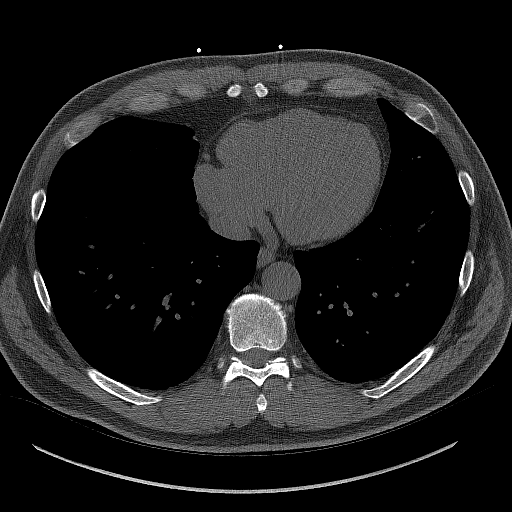
[im 23/46  vessel]
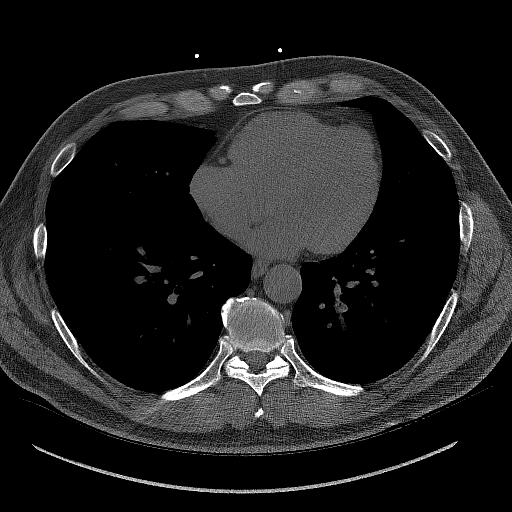
[im 31/46  vessel]
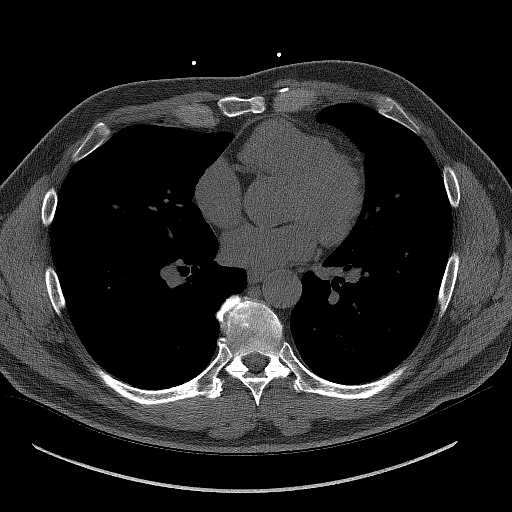
[im 38/46  vessel]
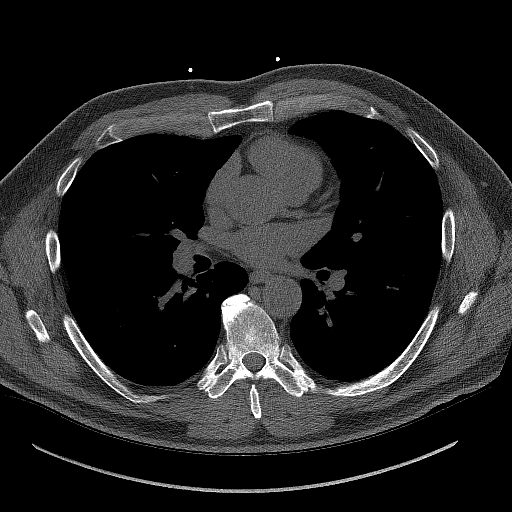

[14 of 20 positions shown; findings below may reference images not displayed]

FINDINGS: Vascular: No significant noncardiac vascular findings.

Mediastinum/Nodes: Visualized mediastinum and hilar regions
demonstrate no lymphadenopathy or masses.

Lungs/Pleura: Visualized lungs show no evidence of pulmonary edema,
consolidation, pneumothorax, nodule or pleural fluid.

Upper Abdomen: No acute abnormality.

Musculoskeletal: No chest wall mass or suspicious bone lesions
identified.
IMPRESSION: No significant incidental findings.
FINDINGS: Non-cardiac: See separate report from [REDACTED].

Ascending Aorta: Normal size

Pericardium: Normal

Coronary arteries: Normal origin of left and right coronary
arteries. Distribution of arterial calcifications if present, as
noted below;

LM 0

LAD 0

LCx 0

RCA 0

Total 0

IMPRESSION AND RECOMMENDATION:
1. Normal coronary calcium score of 0. Patient is low risk for
coronary events.

2. CAC 0, KAMIGISHA BUTOYI KANTE0.

3. Continue heart healthy lifestyle and risk factor modification.

Manni Denby

*** End of Addendum ***
EXAM:
OVER-READ INTERPRETATION  CT CHEST

The following report is an over-read performed by radiologist Dr.
Trevon Reel [REDACTED] on 09/02/2021. This
over-read does not include interpretation of cardiac or coronary
anatomy or pathology. The coronary calcium score interpretation by
the cardiologist is attached.
FINDINGS: Vascular: No significant noncardiac vascular findings.

Mediastinum/Nodes: Visualized mediastinum and hilar regions
demonstrate no lymphadenopathy or masses.

Lungs/Pleura: Visualized lungs show no evidence of pulmonary edema,
consolidation, pneumothorax, nodule or pleural fluid.

Upper Abdomen: No acute abnormality.

Musculoskeletal: No chest wall mass or suspicious bone lesions
identified.
IMPRESSION: No significant incidental findings.

## 2023-10-07 ENCOUNTER — Ambulatory Visit: Payer: Self-pay

## 2023-10-07 DIAGNOSIS — Z0000 Encounter for general adult medical examination without abnormal findings: Secondary | ICD-10-CM

## 2023-10-07 LAB — POCT URINALYSIS DIPSTICK
Bilirubin, UA: NEGATIVE
Blood, UA: NEGATIVE
Glucose, UA: NEGATIVE
Ketones, UA: NEGATIVE
Leukocytes, UA: NEGATIVE
Nitrite, UA: NEGATIVE
Protein, UA: NEGATIVE
Spec Grav, UA: 1.01 (ref 1.010–1.025)
Urobilinogen, UA: 0.2 U/dL
pH, UA: 7 (ref 5.0–8.0)

## 2023-10-08 LAB — CMP12+LP+TP+TSH+6AC+PSA+CBC…
ALT: 25 [IU]/L (ref 0–44)
AST: 27 [IU]/L (ref 0–40)
Albumin: 4.5 g/dL (ref 3.8–4.9)
Alkaline Phosphatase: 64 [IU]/L (ref 44–121)
BUN/Creatinine Ratio: 9 (ref 9–20)
BUN: 12 mg/dL (ref 6–24)
Basophils Absolute: 0 10*3/uL (ref 0.0–0.2)
Basos: 1 %
Bilirubin Total: 0.8 mg/dL (ref 0.0–1.2)
Calcium: 9.2 mg/dL (ref 8.7–10.2)
Chloride: 103 mmol/L (ref 96–106)
Chol/HDL Ratio: 3.2 {ratio} (ref 0.0–5.0)
Cholesterol, Total: 161 mg/dL (ref 100–199)
Creatinine, Ser: 1.32 mg/dL — ABNORMAL HIGH (ref 0.76–1.27)
EOS (ABSOLUTE): 0.1 10*3/uL (ref 0.0–0.4)
Eos: 1 %
Estimated CHD Risk: <0.5 times avg. (ref 0.0–1.0)
Free Thyroxine Index: 1.8 (ref 1.2–4.9)
GGT: 13 [IU]/L (ref 0–65)
Globulin, Total: 2 g/dL (ref 1.5–4.5)
Glucose: 96 mg/dL (ref 70–99)
HDL: 50 mg/dL (ref >39)
Hematocrit: 41 % (ref 37.5–51.0)
Hemoglobin: 13.9 g/dL (ref 13.0–17.7)
Immature Grans (Abs): 0 10*3/uL (ref 0.0–0.1)
Immature Granulocytes: 0 %
Iron: 178 ug/dL — ABNORMAL HIGH (ref 38–169)
LDH: 179 [IU]/L (ref 121–224)
LDL Chol Calc (NIH): 95 mg/dL (ref 0–99)
Lymphocytes Absolute: 1.3 10*3/uL (ref 0.7–3.1)
Lymphs: 30 %
MCH: 30.4 pg (ref 26.6–33.0)
MCHC: 33.9 g/dL (ref 31.5–35.7)
MCV: 90 fL (ref 79–97)
Monocytes Absolute: 0.5 10*3/uL (ref 0.1–0.9)
Monocytes: 10 %
Neutrophils Absolute: 2.5 10*3/uL (ref 1.4–7.0)
Neutrophils: 58 %
Phosphorus: 3.1 mg/dL (ref 2.8–4.1)
Platelets: 235 10*3/uL (ref 150–450)
Potassium: 4.8 mmol/L (ref 3.5–5.2)
Prostate Specific Ag, Serum: 1.3 ng/mL (ref 0.0–4.0)
RBC: 4.57 x10E6/uL (ref 4.14–5.80)
RDW: 13.1 % (ref 11.6–15.4)
Sodium: 141 mmol/L (ref 134–144)
T3 Uptake Ratio: 27 % (ref 24–39)
T4, Total: 6.6 ug/dL (ref 4.5–12.0)
TSH: 2.26 u[IU]/mL (ref 0.450–4.500)
Total Protein: 6.5 g/dL (ref 6.0–8.5)
Triglycerides: 87 mg/dL (ref 0–149)
Uric Acid: 5.3 mg/dL (ref 3.8–8.4)
VLDL Cholesterol Cal: 16 mg/dL (ref 5–40)
WBC: 4.3 10*3/uL (ref 3.4–10.8)
eGFR: 63 mL/min/{1.73_m2} (ref >59)

## 2023-10-14 ENCOUNTER — Other Ambulatory Visit: Payer: Self-pay | Admitting: Physician Assistant

## 2023-10-14 ENCOUNTER — Encounter: Payer: 59 | Admitting: Physician Assistant

## 2023-10-14 ENCOUNTER — Ambulatory Visit: Payer: Self-pay | Admitting: Physician Assistant

## 2023-10-14 ENCOUNTER — Encounter: Payer: Self-pay | Admitting: Physician Assistant

## 2023-10-14 VITALS — BP 132/90 | HR 76 | Temp 97.6°F | Resp 16 | Ht 75.0 in | Wt 250.0 lb

## 2023-10-14 DIAGNOSIS — Z0000 Encounter for general adult medical examination without abnormal findings: Secondary | ICD-10-CM

## 2023-10-14 MED ORDER — BACLOFEN 10 MG PO TABS: 10.0000 mg | ORAL_TABLET | Freq: Two times a day (BID) | ORAL | 3 refills | Status: DC

## 2023-10-14 MED ORDER — FLUTICASONE PROPIONATE 50 MCG/ACT NA SUSP: 2.0000 | Freq: Every day | NASAL | 6 refills | Status: DC

## 2023-10-14 NOTE — Progress Notes (Signed)
Pt presents today to complete physical. Pt requesting baclofen refill up until his mar apt. Pt didn't voice any other concerns.

## 2023-10-14 NOTE — Progress Notes (Signed)
[facility] ____________________________________________   None    (approximate)  I have reviewed the triage vital signs and the nursing notes.   HISTORY  Chief Complaint Annual Exam   HPI [name] is a 57 y.o. male patient presents for annual physical exam.  Patient voices no concerns or complaints.  Requests refill of Flonase and baclofen.        Past Medical History:  Diagnosis Date   Chronic headaches    Occipital neuralgia    Seasonal allergies     Patient Active Problem List   Diagnosis Date Noted   Seasonal allergic rhinitis due to pollen 01/06/2020   BMI 32.0-32.9,adult 01/06/2020   Chronic tension-type headache, intractable 01/06/2020   Low vitamin D level 01/06/2020   Low serum vitamin B12 01/06/2020   Borderline high blood pressure 07/25/2019   Benign essential HTN 07/25/2019   Class 1 obesity due to excess calories with serious comorbidity and body mass index (BMI) of 32.0 to 32.9 in adult 07/25/2019   Hyperlipidemia, mixed 07/25/2019   IFG (impaired fasting glucose) 07/25/2019   Intractable migraine without aura and without status migrainosus 03/31/2018   Occipital neuralgia of left side 03/31/2018    Past Surgical History:  Procedure Laterality Date   KNEE SURGERY Right    X 2   NOSE SURGERY      Prior to Admission medications   Medication Sig Start Date End Date Taking? Authorizing Provider  chlorpheniramine (CHLOR-TRIMETON) 4 MG tablet Take 4 mg by mouth every 4 (four) hours as needed for allergies.   Yes [provider]  fluticasone (FLONASE) 50 MCG/ACT nasal spray Place 1 spray into both nostrils daily. 01/04/20 10/14/23 Yes Betancourt, Jarold Song, NP  lisinopril (ZESTRIL) 10 MG tablet Take 1 tablet (10 mg total) by mouth daily. 01/26/23 02/20/24 Yes Joni Reining, PA-C  baclofen (LIORESAL) 10 MG tablet Take 1 tablet (10 mg total) by mouth 2 (two) times daily. 10/14/23   Joni Reining, PA-C   cetirizine (ZYRTEC) 10 MG tablet Take 10 mg by mouth daily. Patient not taking: Reported on 10/14/2023    [provider]  fexofenadine (ALLEGRA) 180 MG tablet Take 1 tablet (180 mg total) by mouth daily. Patient not taking: Reported on 01/20/2023 12/25/21   Joni Reining, PA-C    Allergies Penicillins and Pollen extract  Family History  Problem Relation Age of Onset   Cancer Maternal Grandmother    Heart disease Paternal Grandfather     Social History Social History   Tobacco Use   Smoking status: Never   Smokeless tobacco: Never  Substance Use Topics   Alcohol use: Never    Review of Systems  Constitutional: No fever/chills Eyes: No visual changes. ENT: No sore throat. Cardiovascular: Denies chest pain. Respiratory: Denies shortness of breath. Gastrointestinal: No abdominal pain.  No nausea, no vomiting.  No diarrhea.  No constipation. Genitourinary: Negative for dysuria. Musculoskeletal: Negative for back pain. Skin: Negative for rash. Neurological: Positive for headaches, but denies focal weakness or numbness. Endocrine: Hypertension and IFG Allergic/Immunilogical: Penicillin and pollen extract ____________________________________________   PHYSICAL EXAM:  VITAL SIGNS: BP 132/90  Pulse 76  Resp 16  Temp 97.6 F (36.4 C)  Temp src Temporal  SpO2 76 %  Weight 250 lb (113.4 kg)  Height 6\' 3"  (1.905 m)   Constitutional: Alert and oriented. Well appearing and in no acute distress. Eyes: Conjunctivae are normal. PERRL. EOMI. Head: Atraumatic. Nose: No congestion/rhinnorhea.  Mouth/Throat: Mucous membranes are moist.  Oropharynx non-erythematous. Neck: No stridor.  No cervical spine tenderness to palpation. Hematological/Lymphatic/Immunilogical: No cervical lymphadenopathy. Cardiovascular: Normal rate, regular rhythm. Grossly normal heart sounds.  Good peripheral circulation. Respiratory: Normal respiratory effort.  No retractions. Lungs  CTAB. Gastrointestinal: Soft and nontender. No distention. No abdominal bruits. No CVA tenderness. Genitourinary: Deferred Musculoskeletal: No lower extremity tenderness nor edema.  No joint effusions. Neurologic:  Normal speech and language. No gross focal neurologic deficits are appreciated. No gait instability. Skin:  Skin is warm, dry and intact. No rash noted. Psychiatric: Mood and affect are normal. Speech and behavior are normal.  ____________________________________________   LABS          Component Ref Range & Units 7 d ago (10/07/23) 1 yr ago (09/30/22) 1 yr ago (09/15/22) 2 yr ago (09/06/21) 3 yr ago (08/17/20) 3 yr ago (12/29/19)  Color, UA Dark Yellow yellow Light Yellow yellow Yellow yellow  Clarity, UA Clear clear Clear clear Clear clear  Glucose, UA Negative Negative Negative Negative Negative Negative Negative  Bilirubin, UA Negative neg Negative negative Negative negative  Ketones, UA Negative neg Negative negative Negative negative  Spec Grav, UA 1.010 - 1.025 1.010 1.025 1.015 1.015 1.020 1.020  Blood, UA Negative neg Negative negative Negative negative  pH, UA 5.0 - 8.0 7.0 6.0 6.0 6.0 6.0 7.0  Protein, UA Negative Negative Negative Negative Negative Negative Negative  Urobilinogen, UA 0.2 or 1.0 E.U./dL 0.2 0.2 0.2 0.2 0.2 0.2  Nitrite, UA Negative neg Negative negative Negative negative  Leukocytes, UA Negative Negative Negative Negative Negative Negative Negative  Appearance    light    Odor                    View All Conversations on this Encounter                 Component Ref Range & Units 7 d ago (10/07/23) 1 yr ago (09/15/22) 2 yr ago (09/06/21) 3 yr ago (08/17/20) 3 yr ago (12/29/19)  Glucose 70 - 99 mg/dL 96 92 88 94 R 99 R  Uric Acid 3.8 - 8.4 mg/dL 5.3 5.2 CM 6.3 CM 5.8 CM 5.7 CM  Comment:            Therapeutic target for gout patients: <6.0  BUN 6 - 24 mg/dL 12 14 14 17 13   Creatinine, Ser 0.76 - 1.27 mg/dL 9.52 High   8.41 3.24 4.01 1.26  eGFR >59 mL/min/1.73 63 80 67    BUN/Creatinine Ratio 9 - 20 9 13 11 17 10   Sodium 134 - 144 mmol/L 141 140 139 138 138  Potassium 3.5 - 5.2 mmol/L 4.8 4.5 4.6 4.9 4.5  Chloride 96 - 106 mmol/L 103 103 102 101 101  Calcium 8.7 - 10.2 mg/dL 9.2 9.6 9.5 9.2 9.5  Phosphorus 2.8 - 4.1 mg/dL 3.1 3.3 3.0 2.9 3.1  Total Protein 6.0 - 8.5 g/dL 6.5 6.5 6.9 6.8 6.5  Albumin 3.8 - 4.9 g/dL 4.5 4.4 4.8 4.8 4.5  Globulin, Total 1.5 - 4.5 g/dL 2.0 2.1 2.1 2.0 2.0  Bilirubin Total 0.0 - 1.2 mg/dL 0.8 0.5 0.7 0.9 0.7  Alkaline Phosphatase 44 - 121 IU/L 64 64 58 59 CM 62 R  LDH 121 - 224 IU/L 179 169 173 188 170  AST 0 - 40 IU/L 27 19 28 22 20   ALT 0 - 44 IU/L 25 20 22 21 27   GGT 0 - 65 IU/L  13 11 12 15 16   Iron 38 - 169 ug/dL 865 High  784 96 696 High  128  Cholesterol, Total 100 - 199 mg/dL 295 284 132 440 102  Triglycerides 0 - 149 mg/dL 87 90 54 96 725  HDL >36 mg/dL 50 50 53 51 48  VLDL Cholesterol Cal 5 - 40 mg/dL 16 17 11 18 19   LDL Chol Calc (NIH) 0 - 99 mg/dL 95 644 High  82 034 High  102 High   Chol/HDL Ratio 0.0 - 5.0 ratio 3.2 3.5 CM 2.8 CM 3.4 CM 3.5 CM  Comment:                                   T. Chol/HDL Ratio                                             Men  Women                               1/2 Avg.Risk  3.4    3.3                                   Avg.Risk  5.0    4.4                                2X Avg.Risk  9.6    7.1                                3X Avg.Risk 23.4   11.0  Estimated CHD Risk 0.0 - 1.0 times avg.  < 0.5 0.6 CM  < 0.5 CM 0.5 CM 0.6 CM  Comment: The CHD Risk is based on the T. Chol/HDL ratio. Other factors affect CHD Risk such as hypertension, smoking, diabetes, severe obesity, and family history of premature CHD.  TSH 0.450 - 4.500 uIU/mL 2.260 2.240 2.160 1.770 2.110  T4, Total 4.5 - 12.0 ug/dL 6.6 6.3 6.9 6.0 6.6  T3 Uptake Ratio 24 - 39 % 27 30 30 27 29   Free Thyroxine Index 1.2 - 4.9 1.8 1.9 2.1 1.6 1.9   Prostate Specific Ag, Serum 0.0 - 4.0 ng/mL 1.3 1.1 CM 1.0 CM 1.3 CM 1.3 CM  Comment: Roche ECLIA methodology. According to the American Urological Association, Serum PSA should decrease and remain at undetectable levels after radical prostatectomy. The AUA defines biochemical recurrence as an initial PSA value 0.2 ng/mL or greater followed by a subsequent confirmatory PSA value 0.2 ng/mL or greater. Values obtained with different assay methods or kits cannot be used interchangeably. Results cannot be interpreted as absolute evidence of the presence or absence of malignant disease.  WBC 3.4 - 10.8 x10E3/uL 4.3 4.5 3.6 4.0 4.2  RBC 4.14 - 5.80 x10E6/uL 4.57 4.69 4.63 4.63 4.60  Hemoglobin 13.0 - 17.7 g/dL 74.2 59.5 63.8 75.6 43.3  Hematocrit 37.5 - 51.0 % 41.0 41.6 40.4 41.8 41.5  MCV 79 - 97 fL 90 89 87 90 90  MCH 26.6 - 33.0 pg 30.4 30.7 30.7  31.3 31.7  MCHC 31.5 - 35.7 g/dL 16.1 09.6 04.5 40.9 81.1  RDW 11.6 - 15.4 % 13.1 12.4 11.8 12.5 12.4  Platelets 150 - 450 x10E3/uL 235 249 234 242 227  Neutrophils Not Estab. % 58 58 52 57 56  Lymphs Not Estab. % 30 29 35 30 31  Monocytes Not Estab. % 10 10 10 9 9   Eos Not Estab. % 1 2 2 3 3   Basos Not Estab. % 1 1 1 1 1   Neutrophils Absolute 1.4 - 7.0 x10E3/uL 2.5 2.6 1.9 2.3 2.4  Lymphocytes Absolute 0.7 - 3.1 x10E3/uL 1.3 1.3 1.2 1.2 1.3  Monocytes Absolute 0.1 - 0.9 x10E3/uL 0.5 0.4 0.3 0.3 0.4  EOS (ABSOLUTE) 0.0 - 0.4 x10E3/uL 0.1 0.1 0.1 0.1 0.1  Basophils Absolute 0.0 - 0.2 x10E3/uL 0.0 0.1 0.0 0.0 0.0  Immature Granulocytes Not Estab. % 0 0 0 0 0  Immature Grans (Abs)           ___________________________________________  EKG Sinus bradycardia 57 bpm.  Nonspecific QRS widening.  Asymptomatic.  ____________________________________________    ____________________________________________   INITIAL IMPRESSION / ASSESSMENT AND PLAN As part of my medical decision making, I reviewed the following data  within the electronic MEDICAL RECORD NUMBER      No acute findings on physical exam, EKG, or labs.        ____________________________________________   FINAL CLINICAL IMPRESSION Well exam   ED Discharge Orders     None        Note:  This document was prepared using Dragon voice recognition software and may include unintentional dictation errors.

## 2023-10-14 NOTE — Progress Notes (Deleted)
[facility] ____________________________________________   None    (approximate)  I have reviewed the triage vital signs and the nursing notes.   HISTORY  Chief Complaint No chief complaint on file.   HPI Tony Alvarado is a 57 y.o. male patient presents for annual physical exam.  Patient close no concerning complaints.  Patient requests refill of Flonase and baclofen.         Past Medical History:  Diagnosis Date   Chronic headaches    Occipital neuralgia    Seasonal allergies     Patient Active Problem List   Diagnosis Date Noted   Seasonal allergic rhinitis due to pollen 01/06/2020   BMI 32.0-32.9,adult 01/06/2020   Chronic tension-type headache, intractable 01/06/2020   Low vitamin D level 01/06/2020   Low serum vitamin B12 01/06/2020   Borderline high blood pressure 07/25/2019   Benign essential HTN 07/25/2019   Class 1 obesity due to excess calories with serious comorbidity and body mass index (BMI) of 32.0 to 32.9 in adult 07/25/2019   Hyperlipidemia, mixed 07/25/2019   IFG (impaired fasting glucose) 07/25/2019   Intractable migraine without aura and without status migrainosus 03/31/2018   Occipital neuralgia of left side 03/31/2018    Past Surgical History:  Procedure Laterality Date   KNEE SURGERY Right    X 2   NOSE SURGERY      Prior to Admission medications   Medication Sig Start Date End Date Taking? Authorizing Provider  baclofen (LIORESAL) 10 MG tablet Take 1 tablet (10 mg total) by mouth 2 (two) times daily. 10/14/23   Joni Reining, PA-C  cetirizine (ZYRTEC) 10 MG tablet Take 10 mg by mouth daily. Patient not taking: Reported on 10/14/2023    [provider]  chlorpheniramine (CHLOR-TRIMETON) 4 MG tablet Take 4 mg by mouth every 4 (four) hours as needed for allergies.    [provider]  fexofenadine (ALLEGRA) 180 MG tablet Take 1 tablet (180 mg total) by mouth daily. Patient not  taking: Reported on 01/20/2023 12/25/21   Joni Reining, PA-C  fluticasone [facility]) 50 MCG/ACT nasal spray Place 1 spray into both nostrils daily. 01/04/20 10/14/23  Betancourt, Jarold Song, NP  lisinopril (ZESTRIL) 10 MG tablet Take 1 tablet (10 mg total) by mouth daily. 01/26/23 02/20/24  Joni Reining, PA-C    Allergies Penicillins and Pollen extract  Family History  Problem Relation Age of Onset   Cancer Maternal Grandmother    Heart disease Paternal Grandfather     Social History Social History   Tobacco Use   Smoking status: Never   Smokeless tobacco: Never  Substance Use Topics   Alcohol use: Never    Review of Systems Constitutional: No fever/chills Eyes: No visual changes. ENT: No sore throat. Cardiovascular: Denies chest pain. Respiratory: Denies shortness of breath. Gastrointestinal: No abdominal pain.  No nausea, no vomiting.  No diarrhea.  No constipation. Genitourinary: Negative for dysuria. Musculoskeletal: Negative for back pain. Skin: Negative for rash. Neurological: Positive for headaches, but denies focal weakness or numbness. Endocrine: Hypertension and IFG Allergic/Immunilogical: Penicillin and pollen ____________________________________________   PHYSICAL EXAM:  VITAL SIGNS: Constitutional: Alert and oriented. Well appearing and in no acute distress. Eyes: Conjunctivae are normal. PERRL. EOMI. Head: Atraumatic. Nose: No congestion/rhinnorhea. Mouth/Throat: Mucous membranes are moist.  Oropharynx non-erythematous. Neck: No stridor.  {**No cervical spine tenderness to palpation.**} {**Hematological/Lymphatic/Immunilogical: No cervical lymphadenopathy. **}Cardiovascular: Normal rate, regular rhythm. Grossly normal heart sounds.  Good peripheral  circulation. Respiratory: Normal respiratory effort.  No retractions. Lungs CTAB. Gastrointestinal: Soft and nontender. No distention. No abdominal bruits. No CVA tenderness. {**Genitourinary:   **}Musculoskeletal: No lower extremity tenderness nor edema.  No joint effusions. Neurologic:  Normal speech and language. No gross focal neurologic deficits are appreciated. No gait instability. Skin:  Skin is warm, dry and intact. No rash noted. Psychiatric: Mood and affect are normal. Speech and behavior are normal.  ____________________________________________   LABS (all labs ordered are listed, but only abnormal results are displayed)  @EDLABS @ ____________________________________________  EKG  *** ____________________________________________  RADIOLOGY I, Joni Reining, personally viewed and evaluated these images (plain radiographs) as part of my medical decision making, as well as reviewing the written report by the radiologist.  ED MD interpretation:  ***  Official radiology report(s): No results found.  ____________________________________________   PROCEDURES  Procedure(s) performed (including Critical Care):  Procedures   ____________________________________________   INITIAL IMPRESSION / ASSESSMENT AND PLAN / ED COURSE  As part of my medical decision making, I reviewed the following data within the electronic MEDICAL RECORD NUMBER {[id number]::"Notes from prior ED visits","[place] Controlled Substance Database"}        ***  @EDCOURSE @   ____________________________________________   FINAL CLINICAL IMPRESSION(S) / ED DIAGNOSES  @EDCI @   ED Discharge Orders     None        Note:  This document was prepared using Dragon voice recognition software and may include unintentional dictation errors.

## 2023-12-15 ENCOUNTER — Other Ambulatory Visit: Payer: Self-pay

## 2023-12-15 DIAGNOSIS — M5481 Occipital neuralgia: Secondary | ICD-10-CM

## 2023-12-15 DIAGNOSIS — G43019 Migraine without aura, intractable, without status migrainosus: Secondary | ICD-10-CM

## 2023-12-15 MED ORDER — BACLOFEN 10 MG PO TABS: 10.0000 mg | ORAL_TABLET | Freq: Two times a day (BID) | ORAL | 3 refills | Status: DC

## 2024-01-07 DIAGNOSIS — M5481 Occipital neuralgia: Secondary | ICD-10-CM | POA: Diagnosis not present

## 2024-01-07 DIAGNOSIS — G44019 Episodic cluster headache, not intractable: Secondary | ICD-10-CM | POA: Diagnosis not present

## 2024-01-07 DIAGNOSIS — R0683 Snoring: Secondary | ICD-10-CM | POA: Diagnosis not present

## 2024-01-20 ENCOUNTER — Other Ambulatory Visit: Payer: Self-pay | Admitting: Physician Assistant

## 2024-01-20 DIAGNOSIS — I10 Essential (primary) hypertension: Secondary | ICD-10-CM

## 2024-02-15 ENCOUNTER — Other Ambulatory Visit: Payer: Self-pay | Admitting: Physician Assistant

## 2024-02-15 DIAGNOSIS — M5481 Occipital neuralgia: Secondary | ICD-10-CM

## 2024-02-15 DIAGNOSIS — G43019 Migraine without aura, intractable, without status migrainosus: Secondary | ICD-10-CM

## 2024-02-17 DIAGNOSIS — L821 Other seborrheic keratosis: Secondary | ICD-10-CM | POA: Diagnosis not present

## 2024-02-17 DIAGNOSIS — D2272 Melanocytic nevi of left lower limb, including hip: Secondary | ICD-10-CM | POA: Diagnosis not present

## 2024-02-17 DIAGNOSIS — D2262 Melanocytic nevi of left upper limb, including shoulder: Secondary | ICD-10-CM | POA: Diagnosis not present

## 2024-02-17 DIAGNOSIS — D2261 Melanocytic nevi of right upper limb, including shoulder: Secondary | ICD-10-CM | POA: Diagnosis not present

## 2024-02-17 DIAGNOSIS — D225 Melanocytic nevi of trunk: Secondary | ICD-10-CM | POA: Diagnosis not present

## 2024-02-17 DIAGNOSIS — L570 Actinic keratosis: Secondary | ICD-10-CM | POA: Diagnosis not present

## 2024-02-17 DIAGNOSIS — D2271 Melanocytic nevi of right lower limb, including hip: Secondary | ICD-10-CM | POA: Diagnosis not present

## 2024-04-18 ENCOUNTER — Ambulatory Visit: Admitting: Physician Assistant

## 2024-04-18 ENCOUNTER — Encounter: Payer: Self-pay | Admitting: Physician Assistant

## 2024-04-18 ENCOUNTER — Ambulatory Visit: Payer: Self-pay | Admitting: Physician Assistant

## 2024-04-18 VITALS — BP 150/94 | HR 68 | Temp 98.8°F | Resp 12

## 2024-04-18 DIAGNOSIS — R0981 Nasal congestion: Secondary | ICD-10-CM

## 2024-04-18 DIAGNOSIS — H10023 Other mucopurulent conjunctivitis, bilateral: Secondary | ICD-10-CM

## 2024-04-18 MED ORDER — PSEUDOEPH-BROMPHEN-DM 30-2-10 MG/5ML PO SYRP: 5.0000 mL | ORAL_SOLUTION | Freq: Four times a day (QID) | ORAL | 0 refills | Status: AC | PRN

## 2024-04-18 MED ORDER — SULFACETAMIDE SODIUM 10 % OP SOLN: 1.0000 [drp] | OPHTHALMIC | 0 refills | Status: AC

## 2024-04-18 MED ORDER — CETIRIZINE-PSEUDOEPHEDRINE ER 5-120 MG PO TB12: 1.0000 | ORAL_TABLET | Freq: Two times a day (BID) | ORAL | 0 refills | Status: AC

## 2024-04-18 NOTE — Progress Notes (Signed)
   Subjective: Conjunctivitis and nasal congestion    Patient ID: Tony Alvarado, male    DOB: Jun 11, 1966, 58 y.o.   MRN: [id number]  HPI Patient waking this morning manage is requiring a large cough to clear.  Patient stated 4 days of nasal congestion and cough.  Denies recent travel or known contact with COVID-19.   Review of Systems Allergic rhinitis and hypertension.    Objective:   Physical Exam BP 150/94  BP Location Left Arm  Patient Position Sitting  Cuff Size Large  Pulse Rate 68  Temp 98.8 F (37.1 C)  Temp Source Oral  Resp 12  SpO2 99 %  Matted material bilateral eye.  Erythematous conjunctiva. HEENT is remarkable edematous nasal turbinates and bilateral maxillary guarding.  Postnasal drainage. Neck is supple for lymphadenopathy. Lungs are clear to auscultation. Heart regular rate and rhythm.       Assessment & Plan: Bilateral conjunctivitis and sinus congestion  Patient given prescription for Bleph-10 eyedrops, Bromfed-DM, and Zyrtec-D.  Advised to follow-up if no improvement in 5 days.

## 2024-04-18 NOTE — Progress Notes (Signed)
 S/Sx started Saturday morning: Woke up with eyes matted shut Sunday - other eye matted Scratchy throat & sinus drainage Today woke up & felt tired & achy & weak Nasal drainage - dark Cough - started around 10:00 am  Itchy eyes  Around grandkids that had same symptoms - no Dx or Tx - not taken to MD that he's aware of  Denies H/A, facial pain/pressure; ear discomfort  Hasn't taken any OTC medications

## 2024-04-20 ENCOUNTER — Other Ambulatory Visit: Payer: Self-pay | Admitting: Physician Assistant

## 2024-07-06 DIAGNOSIS — H5203 Hypermetropia, bilateral: Secondary | ICD-10-CM | POA: Diagnosis not present

## 2024-07-06 DIAGNOSIS — Z8669 Personal history of other diseases of the nervous system and sense organs: Secondary | ICD-10-CM | POA: Diagnosis not present

## 2024-07-06 DIAGNOSIS — H33311 Horseshoe tear of retina without detachment, right eye: Secondary | ICD-10-CM | POA: Diagnosis not present

## 2024-07-06 DIAGNOSIS — H43812 Vitreous degeneration, left eye: Secondary | ICD-10-CM | POA: Diagnosis not present

## 2024-08-18 DIAGNOSIS — H43813 Vitreous degeneration, bilateral: Secondary | ICD-10-CM | POA: Diagnosis not present

## 2024-08-18 DIAGNOSIS — H33311 Horseshoe tear of retina without detachment, right eye: Secondary | ICD-10-CM | POA: Insufficient documentation

## 2024-08-18 DIAGNOSIS — H2513 Age-related nuclear cataract, bilateral: Secondary | ICD-10-CM | POA: Diagnosis not present

## 2024-08-18 DIAGNOSIS — H33313 Horseshoe tear of retina without detachment, bilateral: Secondary | ICD-10-CM | POA: Diagnosis not present

## 2024-09-06 ENCOUNTER — Ambulatory Visit: Payer: Self-pay

## 2024-09-06 DIAGNOSIS — Z0000 Encounter for general adult medical examination without abnormal findings: Secondary | ICD-10-CM

## 2024-09-06 LAB — POCT URINALYSIS DIPSTICK
Bilirubin, UA: NEGATIVE
Blood, UA: NEGATIVE
Glucose, UA: NEGATIVE
Ketones, UA: NEGATIVE
Leukocytes, UA: NEGATIVE
Nitrite, UA: NEGATIVE
Protein, UA: POSITIVE — AB
Spec Grav, UA: <=1.005 — AB (ref 1.010–1.025)
Urobilinogen, UA: 0.2 U/dL
pH, UA: 8 (ref 5.0–8.0)

## 2024-09-07 LAB — CMP12+LP+TP+TSH+6AC+PSA+CBC…
ALT: 26 IU/L (ref 0–44)
AST: 28 IU/L (ref 0–40)
Albumin: 4.8 g/dL (ref 3.8–4.9)
Alkaline Phosphatase: 65 IU/L (ref 47–123)
BUN/Creatinine Ratio: 9 (ref 9–20)
BUN: 11 mg/dL (ref 6–24)
Basophils Absolute: 0 x10E3/uL (ref 0.0–0.2)
Basos: 1 %
Bilirubin Total: 0.7 mg/dL (ref 0.0–1.2)
Calcium: 9.8 mg/dL (ref 8.7–10.2)
Chloride: 102 mmol/L (ref 96–106)
Chol/HDL Ratio: 3.4 ratio (ref 0.0–5.0)
Cholesterol, Total: 178 mg/dL (ref 100–199)
Creatinine, Ser: 1.23 mg/dL (ref 0.76–1.27)
EOS (ABSOLUTE): 0.1 x10E3/uL (ref 0.0–0.4)
Eos: 2 %
Estimated CHD Risk: 0.5 times avg. (ref 0.0–1.0)
Free Thyroxine Index: 2 (ref 1.2–4.9)
GGT: 15 IU/L (ref 0–65)
Globulin, Total: 2.1 g/dL (ref 1.5–4.5)
Glucose: 93 mg/dL (ref 70–99)
HDL: 53 mg/dL (ref >39)
Hematocrit: 44.2 % (ref 37.5–51.0)
Hemoglobin: 15.1 g/dL (ref 13.0–17.7)
Immature Grans (Abs): 0 x10E3/uL (ref 0.0–0.1)
Immature Granulocytes: 0 %
Iron: 169 ug/dL (ref 38–169)
LDH: 197 IU/L (ref 121–224)
LDL Chol Calc (NIH): 105 mg/dL — ABNORMAL HIGH (ref 0–99)
Lymphocytes Absolute: 1.6 x10E3/uL (ref 0.7–3.1)
Lymphs: 33 %
MCH: 31.3 pg (ref 26.6–33.0)
MCHC: 34.2 g/dL (ref 31.5–35.7)
MCV: 92 fL (ref 79–97)
Monocytes Absolute: 0.4 x10E3/uL (ref 0.1–0.9)
Monocytes: 9 %
Neutrophils Absolute: 2.7 x10E3/uL (ref 1.4–7.0)
Neutrophils: 55 %
Phosphorus: 3.2 mg/dL (ref 2.8–4.1)
Platelets: 247 x10E3/uL (ref 150–450)
Potassium: 4.7 mmol/L (ref 3.5–5.2)
Prostate Specific Ag, Serum: 1.1 ng/mL (ref 0.0–4.0)
RBC: 4.83 x10E6/uL (ref 4.14–5.80)
RDW: 13.1 % (ref 11.6–15.4)
Sodium: 140 mmol/L (ref 134–144)
T3 Uptake Ratio: 28 % (ref 24–39)
T4, Total: 7 ug/dL (ref 4.5–12.0)
TSH: 2.24 u[IU]/mL (ref 0.450–4.500)
Total Protein: 6.9 g/dL (ref 6.0–8.5)
Triglycerides: 109 mg/dL (ref 0–149)
Uric Acid: 5.9 mg/dL (ref 3.8–8.4)
VLDL Cholesterol Cal: 20 mg/dL (ref 5–40)
WBC: 4.8 x10E3/uL (ref 3.4–10.8)
eGFR: 68 mL/min/1.73 (ref >59)

## 2024-09-13 ENCOUNTER — Encounter: Admitting: Physician Assistant

## 2024-09-14 ENCOUNTER — Encounter: Payer: Self-pay | Admitting: Physician Assistant

## 2024-09-14 ENCOUNTER — Ambulatory Visit: Payer: Self-pay | Admitting: Physician Assistant

## 2024-09-14 VITALS — BP 134/93 | HR 61 | Temp 97.6°F | Resp 16 | Ht 75.0 in | Wt 255.0 lb

## 2024-09-14 DIAGNOSIS — Z024 Encounter for examination for driving license: Secondary | ICD-10-CM

## 2024-09-14 DIAGNOSIS — Z0000 Encounter for general adult medical examination without abnormal findings: Secondary | ICD-10-CM

## 2024-09-14 LAB — POCT URINALYSIS DIPSTICK
Bilirubin, UA: NEGATIVE
Blood, UA: NEGATIVE
Glucose, UA: NEGATIVE
Ketones, UA: NEGATIVE
Leukocytes, UA: NEGATIVE
Nitrite, UA: NEGATIVE
Protein, UA: POSITIVE — AB
Spec Grav, UA: <=1.005 — AB (ref 1.010–1.025)
Urobilinogen, UA: 0.2 U/dL
pH, UA: 8 (ref 5.0–8.0)

## 2024-09-14 NOTE — Progress Notes (Signed)
   Subjective: DOT recertification    Patient ID: Tony Alvarado, male    DOB: 18-May-1966, 58 y.o.   MRN: [id number]  HPI Patient presents for DOT recertification.  Patient voiced no concerns or complaints.   Review of Systems     Objective:   Physical Exam BP 134/93  Cuff Size Large  Pulse Rate 61  Temp 97.6 F (36.4 C)  Temp Source Temporal  Weight 255 lb (115.7 kg)  Height 6' 3 (1.905 m)  Resp 16  SpO2 96 %            Component Ref Range & Units (hover) 09:49 8 d ago 11 mo ago 1 yr ago 2 yr ago 3 yr ago 4 yr ago  Color, UA YELLOW yellow Dark Yellow yellow Light Yellow yellow Yellow  Clarity, UA CLEAR clear Clear clear Clear clear Clear  Glucose, UA Negative Negative Negative Negative Negative Negative Negative  Bilirubin, UA NEG neg Negative neg Negative negative Negative  Ketones, UA NEG neg Negative neg Negative negative Negative  Spec Grav, UA <=1.005 Abnormal  <=1.005 Abnormal  VC 1.010 1.025 1.015 1.015 1.020  Blood, UA NEG neg Negative neg Negative negative Negative  pH, UA 8.0 VC 8.0 VC 7.0 6.0 6.0 6.0 6.0  Protein, UA Positive Abnormal  Positive Abnormal  VC, CM Negative Negative Negative Negative Negative  Comment: TRACE -+  Urobilinogen, UA 0.2 0.2 0.2 0.2 0.2 0.2 0.2  Nitrite, UA NEG neg Negative neg Negative negative Negative  Leukocytes, UA Negative Negative Negative Negative Negative Negative Negative  Appearance      light   Odor                 HEENT is unremarkable. Neck is supple without lymphadenopathy or bruits. Lungs are clear to auscultation. Heart is regular rate and rhythm. Abdomen with negative HSM, normoactive bowel sounds, soft and nontender to palpation. No obvious deformity to the cervical or lumbar spine.  Patient has full and equal range of motion with of the cervical and lumbar spine. No obvious deformity to the upper or lower extremities.  Patient has full and equal range of motion of the upper and lower extremities. Cranial  nerves Alvarado through XII grossly intact.        Assessment & Plan: DOT recertification  Patient meets requirement for DOT certification for 1 year.

## 2024-09-14 NOTE — Progress Notes (Signed)
 [facility]  ____________________________________________   None    (approximate)  I have reviewed the triage vital signs and the nursing notes.   HISTORY  Chief Complaint Annual Exam    HPI Tony Alvarado is a 58 y.o. male patient presents for annual physical exam.  Patient voices no concerns or complaints.         Past Medical History:  Diagnosis Date   Chronic headaches    Occipital neuralgia    Seasonal allergies     Patient Active Problem List   Diagnosis Date Noted   Seasonal allergic rhinitis due to pollen 01/06/2020   BMI 32.0-32.9,adult 01/06/2020   Chronic tension-type headache, intractable 01/06/2020   Low vitamin D level 01/06/2020   Low serum vitamin B12 01/06/2020   Borderline high blood pressure 07/25/2019   Benign essential HTN 07/25/2019   Class 1 obesity due to excess calories with serious comorbidity and body mass index (BMI) of 32.0 to 32.9 in adult 07/25/2019   Hyperlipidemia, mixed 07/25/2019   IFG (impaired fasting glucose) 07/25/2019   Intractable migraine without aura and without status migrainosus 03/31/2018   Occipital neuralgia of left side 03/31/2018    Past Surgical History:  Procedure Laterality Date   KNEE SURGERY Right    X 2   NOSE SURGERY      Prior to Admission medications   Medication Sig Start Date End Date Taking? Authorizing Provider  baclofen  (LIORESAL ) 10 MG tablet TAKE 1 TABLET BY MOUTH TWICE A DAY 02/15/24   Cheryln Palma, PA-C  brompheniramine-pseudoephedrine -DM (BROMFED DM) 30-2-10 MG/5ML syrup Take 5 mLs by mouth 4 (four) times daily as needed. 04/18/24   Claudene Tanda POUR, PA-C  cetirizine -pseudoephedrine  (ZYRTEC -D) 5-120 MG tablet Take 1 tablet by mouth 2 (two) times daily. 04/18/24   Claudene Tanda POUR, PA-C  fluticasone  (FLONASE ) 50 MCG/ACT nasal spray SPRAY 2 SPRAYS INTO EACH NOSTRIL EVERY DAY 04/20/24   Claudene Tanda POUR, PA-C  lisinopril  (ZESTRIL ) 10 MG tablet TAKE 1 TABLET  BY MOUTH EVERY DAY 01/20/24   Claudene Tanda POUR, PA-C  sulfacetamide  (BLEPH -10) 10 % ophthalmic solution Place 1 drop into both eyes every 3 (three) hours. 04/18/24   Claudene Tanda POUR, PA-C    Allergies Penicillins and Pollen extract  Family History  Problem Relation Age of Onset   Cancer Maternal Grandmother    Heart disease Paternal Grandfather     Social History Social History   Tobacco Use   Smoking status: Never   Smokeless tobacco: Never  Substance Use Topics   Alcohol use: Never    Review of Systems Constitutional: No fever/chills Eyes: No visual changes. ENT: No sore throat. Cardiovascular: Denies chest pain. Respiratory: Denies shortness of breath. Gastrointestinal: No abdominal pain.  No nausea, no vomiting.  No diarrhea.  No constipation. Genitourinary: Negative for dysuria. Musculoskeletal: Negative for back pain. Skin: Negative for rash. Neurological: Positive for headaches, but denies focal weakness or numbness.  Endocrine: Hypertension Allergic/Immunilogical: Penicillin and pollen  ____________________________________________   PHYSICAL EXAM:  VITAL SIGNS: BP 134/93  Cuff Size Large  Pulse Rate 61  Temp 97.6 F (36.4 C)  Temp Source Temporal  Weight 255 lb (115.7 kg)  Height 6' 3 (1.905 m)  Resp 16  SpO2 96 %   BMI: 31.87 kg/m2  BSA: 2.47 m2   Constitutional: Alert and oriented. Well appearing and in no acute distress. Eyes: Conjunctivae are normal. PERRL. EOMI. Head: Atraumatic. Nose: No congestion/rhinnorhea. Mouth/Throat: Mucous membranes are  moist.  Oropharynx non-erythematous. Neck: No stridor.  No cervical spine tenderness to palpation. Hematological/Lymphatic/Immunilogical: No cervical lymphadenopathy. Cardiovascular: Normal rate, regular rhythm. Grossly normal heart sounds.  Good peripheral circulation. Respiratory: Normal respiratory effort.  No retractions. Lungs CTAB. Gastrointestinal: Soft and nontender. No distention. No  abdominal bruits. No CVA tenderness. Genitourinary: Deferred Musculoskeletal: No lower extremity tenderness nor edema.  No joint effusions. Neurologic:  Normal speech and language. No gross focal neurologic deficits are appreciated. No gait instability. Skin:  Skin is warm, dry and intact. No rash noted. Psychiatric: Mood and affect are normal. Speech and behavior are normal.  ____________________________________________   LABS ____         Component Ref Range & Units (hover) 8 d ago (09/06/24) 11 mo ago (10/07/23) 2 yr ago (09/15/22) 3 yr ago (09/06/21) 4 yr ago (08/17/20) 4 yr ago (12/29/19)  Glucose 93 96 92 88 94 R 99 R  Uric Acid 5.9 5.3 CM 5.2 CM 6.3 CM 5.8 CM 5.7 CM  Comment:            Therapeutic target for gout patients: <6.0  BUN 11 12 14 14 17 13   Creatinine, Ser 1.23 1.32 High  1.09 1.27 1.03 1.26  eGFR 68 63 80 67    BUN/Creatinine Ratio 9 9 13 11 17 10   Sodium 140 141 140 139 138 138  Potassium 4.7 4.8 4.5 4.6 4.9 4.5  Chloride 102 103 103 102 101 101  Calcium 9.8 9.2 9.6 9.5 9.2 9.5  Phosphorus 3.2 3.1 3.3 3.0 2.9 3.1  Total Protein 6.9 6.5 6.5 6.9 6.8 6.5  Albumin 4.8 4.5 4.4 4.8 4.8 4.5  Globulin, Total 2.1 2.0 2.1 2.1 2.0 2.0  Bilirubin Total 0.7 0.8 0.5 0.7 0.9 0.7  Alkaline Phosphatase 65 64 R 64 R 58 R 59 R, CM 62 R  LDH 197 179 169 173 188 170  AST 28 27 19 28 22 20   ALT 26 25 20 22 21 27   GGT 15 13 11 12 15 16   Iron 169 178 High  143 96 192 High  128  Cholesterol, Total 178 161 175 146 174 169  Triglycerides 109 87 90 54 96 106  HDL 53 50 50 53 51 48  VLDL Cholesterol Cal 20 16 17 11 18 19   LDL Chol Calc (NIH) 105 High  95 108 High  82 105 High  102 High   Chol/HDL Ratio 3.4 3.2 CM 3.5 CM 2.8 CM 3.4 CM 3.5 CM  Comment:                                   T. Chol/HDL Ratio                                             Men  Women                               1/2 Avg.Risk  3.4    3.3                                   Avg.Risk  5.0    4.4  2X Avg.Risk  9.6    7.1                                3X Avg.Risk 23.4   11.0  Estimated CHD Risk 0.5  < 0.5 CM 0.6 CM  < 0.5 CM 0.5 CM 0.6 CM  Comment: The CHD Risk is based on the T. Chol/HDL ratio. Other factors affect CHD Risk such as hypertension, smoking, diabetes, severe obesity, and family history of premature CHD.  TSH 2.240 2.260 2.240 2.160 1.770 2.110  T4, Total 7.0 6.6 6.3 6.9 6.0 6.6  T3 Uptake Ratio 28 27 30 30 27 29   Free Thyroxine Index 2.0 1.8 1.9 2.1 1.6 1.9  Prostate Specific Ag, Serum 1.1 1.3 CM 1.1 CM 1.0 CM 1.3 CM 1.3 CM  Comment: Roche ECLIA methodology. According to the American Urological Association, Serum PSA should decrease and remain at undetectable levels after radical prostatectomy. The AUA defines biochemical recurrence as an initial PSA value 0.2 ng/mL or greater followed by a subsequent confirmatory PSA value 0.2 ng/mL or greater. Values obtained with different assay methods or kits cannot be used interchangeably. Results cannot be interpreted as absolute evidence of the presence or absence of malignant disease.  WBC 4.8 4.3 4.5 3.6 4.0 4.2  RBC 4.83 4.57 4.69 4.63 4.63 4.60  Hemoglobin 15.1 13.9 14.4 14.2 14.5 14.6  Hematocrit 44.2 41.0 41.6 40.4 41.8 41.5  MCV 92 90 89 87 90 90  MCH 31.3 30.4 30.7 30.7 31.3 31.7  MCHC 34.2 33.9 34.6 35.1 34.7 35.2  RDW 13.1 13.1 12.4 11.8 12.5 12.4  Platelets 247 235 249 234 242 227  Neutrophils 55 58 58 52 57 56  Lymphs 33 30 29 35 30 31  Monocytes 9 10 10 10 9 9   Eos 2 1 2 2 3 3   Basos 1 1 1 1 1 1   Neutrophils Absolute 2.7 2.5 2.6 1.9 2.3 2.4  Lymphocytes Absolute 1.6 1.3 1.3 1.2 1.2 1.3  Monocytes Absolute 0.4 0.5 0.4 0.3 0.3 0.4  EOS (ABSOLUTE) 0.1 0.1 0.1 0.1 0.1 0.1  Basophils Absolute 0.0 0.0 0.1 0.0 0.0 0.0  Immature Granulocytes 0 0 0 0 0 0  Immature Grans (Abs) 0.0 0.0 0.0 0.0 0.0 0.0             _          Component Ref Range & Units (hover) 8 d ago (09/06/24) 11 mo  ago (10/07/23) 1 yr ago (09/30/22) 2 yr ago (09/15/22) 3 yr ago (09/06/21) 4 yr ago (08/17/20) 4 yr ago (12/29/19)  Color, UA yellow Dark Yellow yellow Light Yellow yellow Yellow yellow  Clarity, UA clear Clear clear Clear clear Clear clear  Glucose, UA Negative Negative Negative Negative Negative Negative Negative  Bilirubin, UA neg Negative neg Negative negative Negative negative  Ketones, UA neg Negative neg Negative negative Negative negative  Spec Grav, UA <=1.005 Abnormal  VC 1.010 1.025 1.015 1.015 1.020 1.020  Blood, UA neg Negative neg Negative negative Negative negative  pH, UA 8.0 VC 7.0 6.0 6.0 6.0 6.0 7.0  Protein, UA Positive Abnormal  VC Negative Negative Negative Negative Negative Negative  Comment: trace -+  Urobilinogen, UA 0.2 0.2 0.2 0.2 0.2 0.2 0.2  Nitrite, UA neg Negative neg Negative negative Negative negative  Leukocytes, UA Negative Negative Negative Negative Negative Negative Negative  Appearance     light    Odor  _______________________________________  EKG  Sinus bradycardia 50.  Nonspecific QRS widening. ____________________________________________   ____________________________________________   INITIAL IMPRESSION / ASSESSMENT AND PLAN  As part of my medical decision making, I reviewed the following data within the electronic MEDICAL RECORD NUMBER      No acute findings on physical exam, or labs.        ____________________________________________   FINAL CLINICAL IMPRESSION Well exam   ED Discharge Orders     None        Note:  This document was prepared using Dragon voice recognition software and may include unintentional dictation errors.

## 2024-09-14 NOTE — Progress Notes (Signed)
 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

## 2024-09-14 NOTE — Progress Notes (Signed)
Pt presents today to complete physical, pt denies any issues or concerns at this time/CL,RMA
